# Patient Record
Sex: Female | Born: 1940
Health system: Southern US, Community
[De-identification: ages and names within clinical notes are randomized; demographics above are authoritative.]

## PROBLEM LIST (undated history)

## (undated) DIAGNOSIS — M1990 Unspecified osteoarthritis, unspecified site: Secondary | ICD-10-CM

## (undated) DIAGNOSIS — D649 Anemia, unspecified: Secondary | ICD-10-CM

## (undated) DIAGNOSIS — F329 Major depressive disorder, single episode, unspecified: Secondary | ICD-10-CM

## (undated) DIAGNOSIS — J40 Bronchitis, not specified as acute or chronic: Secondary | ICD-10-CM

## (undated) DIAGNOSIS — F419 Anxiety disorder, unspecified: Secondary | ICD-10-CM

## (undated) DIAGNOSIS — J189 Pneumonia, unspecified organism: Secondary | ICD-10-CM

## (undated) DIAGNOSIS — Z96659 Presence of unspecified artificial knee joint: Secondary | ICD-10-CM

## (undated) DIAGNOSIS — K219 Gastro-esophageal reflux disease without esophagitis: Secondary | ICD-10-CM

## (undated) DIAGNOSIS — R002 Palpitations: Secondary | ICD-10-CM

## (undated) DIAGNOSIS — F32A Depression, unspecified: Secondary | ICD-10-CM

---

## 1978-04-09 HISTORY — PX: TUBAL LIGATION: SHX77

## 1998-02-23 ENCOUNTER — Ambulatory Visit (HOSPITAL_COMMUNITY): Admission: RE | Admit: 1998-02-23 | Discharge: 1998-02-23 | Payer: Self-pay | Admitting: Gastroenterology

## 1999-04-10 HISTORY — PX: INCONTINENCE SURGERY: SHX676

## 1999-09-20 ENCOUNTER — Other Ambulatory Visit: Admission: RE | Admit: 1999-09-20 | Discharge: 1999-09-20 | Payer: Self-pay | Admitting: Obstetrics and Gynecology

## 2000-06-25 ENCOUNTER — Other Ambulatory Visit: Admission: RE | Admit: 2000-06-25 | Discharge: 2000-06-25 | Payer: Self-pay | Admitting: Obstetrics and Gynecology

## 2000-07-18 ENCOUNTER — Other Ambulatory Visit: Admission: RE | Admit: 2000-07-18 | Discharge: 2000-07-18 | Payer: Self-pay | Admitting: Obstetrics and Gynecology

## 2000-07-18 ENCOUNTER — Encounter (INDEPENDENT_AMBULATORY_CARE_PROVIDER_SITE_OTHER): Payer: Self-pay

## 2000-08-23 ENCOUNTER — Other Ambulatory Visit: Admission: RE | Admit: 2000-08-23 | Discharge: 2000-08-23 | Payer: Self-pay | Admitting: Obstetrics and Gynecology

## 2000-08-23 ENCOUNTER — Encounter (INDEPENDENT_AMBULATORY_CARE_PROVIDER_SITE_OTHER): Payer: Self-pay

## 2000-12-24 ENCOUNTER — Other Ambulatory Visit: Admission: RE | Admit: 2000-12-24 | Discharge: 2000-12-24 | Payer: Self-pay | Admitting: Obstetrics and Gynecology

## 2001-02-25 ENCOUNTER — Ambulatory Visit (HOSPITAL_COMMUNITY): Admission: RE | Admit: 2001-02-25 | Discharge: 2001-02-25 | Payer: Self-pay | Admitting: Urology

## 2001-04-07 ENCOUNTER — Encounter: Admission: RE | Admit: 2001-04-07 | Discharge: 2001-04-07 | Payer: Self-pay

## 2001-09-22 ENCOUNTER — Other Ambulatory Visit: Admission: RE | Admit: 2001-09-22 | Discharge: 2001-09-22 | Payer: Self-pay | Admitting: Obstetrics and Gynecology

## 2006-01-20 ENCOUNTER — Emergency Department (HOSPITAL_COMMUNITY): Admission: EM | Admit: 2006-01-20 | Discharge: 2006-01-20 | Payer: Self-pay | Admitting: Emergency Medicine

## 2006-03-21 ENCOUNTER — Ambulatory Visit (HOSPITAL_BASED_OUTPATIENT_CLINIC_OR_DEPARTMENT_OTHER): Admission: RE | Admit: 2006-03-21 | Discharge: 2006-03-21 | Payer: Self-pay | Admitting: Urology

## 2006-03-21 ENCOUNTER — Encounter (INDEPENDENT_AMBULATORY_CARE_PROVIDER_SITE_OTHER): Payer: Self-pay | Admitting: *Deleted

## 2008-09-02 ENCOUNTER — Other Ambulatory Visit: Admission: RE | Admit: 2008-09-02 | Discharge: 2008-09-02 | Payer: Self-pay | Admitting: Family Medicine

## 2010-04-09 DIAGNOSIS — R002 Palpitations: Secondary | ICD-10-CM

## 2010-04-09 HISTORY — PX: REPLACEMENT TOTAL KNEE: SUR1224

## 2010-04-09 HISTORY — DX: Palpitations: R00.2

## 2010-08-25 NOTE — Op Note (Signed)
NAMEBAYLER, Janice Allen              ACCOUNT NO.:  [id number]   MEDICAL RECORD NO.:  [id number]          PATIENT TYPE:  AMB   LOCATION:  [facility]                         FACILITY:  [facility]   PHYSICIAN:  Bertram Millard. Dahlstedt, M.D.DATE OF BIRTH:  Nov 28, 1940   DATE OF PROCEDURE:  03/21/2006  DATE OF DISCHARGE:                               OPERATIVE REPORT   PREOPERATIVE DIAGNOSIS:  History of right renal mass/right renal pelvic  filling defect.   POSTOPERATIVE DIAGNOSIS:  No evident filling defect, probable resolve  pyelonephritis with bleeding.   PROCEDURE:  Cystoscopy, right renal washings, right retrograde  pyelogram.   SURGEON:  Bertram Millard. Dahlstedt, M.D.   ANESTHESIA:  General with LMA.   COMPLICATIONS:  None.   BRIEF HISTORY:  This nice lady first presented to me about 2 months ago  following an emergency room visit.  She presented with gross hematuria  and flank pain.  CT showed a probable mass in the right lower pole as  well as hydronephrosis and a filling defect in the right renal pelvis.  She did have an infection at that time.  That was treated.  Followup  contrasted CT revealed no evident renal pelvic filling defect.  There  was no evidence of residual right renal mass.  Cytologies were  negative/atypical.   At this point, I have presented the patient with either follow her with  a CT scan in a few more months or performing a more definitive cysto,  retrograde and renal washings with possible ureteroscopy on that side.  We need to rule out that she had has no small filling  defects/transitional cell carcinoma.   She has chosen to undergo right retrograde.  Risks and complications of  the procedure have been discussed with the patient.  She understands  these and desires to proceed.   DESCRIPTION OF PROCEDURE:  The patient was administered preoperative IV  antibiotics, the surgical side marked and the patient identified otitis  media the holding area.  She was taken  to the operating room where  general anesthetic was administered.  She was placed in the dorsal  lithotomy position.  Genitalia and perineum were prepped and draped.  A  22-French panendoscope was advanced into her bladder.  Her bladder  appeared normal.  No lesions, no stones, no trabeculations.  Ureteral  orifices were normal in configuration and location.  No blood was seen  coming from either orifice.  The urethra was normal.  The right ureteral  orifice was cannulated with a 6 open-end catheter and with the  assistance of a guidewire was advanced all the way up into the region of  the renal pelvis.  Washings were taken.  There was a little bit of  blood, most likely from some traumatic catheterization.  At first, there  was no blood with the washings.  The washings were sent for cytology.   At this point, retrogrades were performed.  The ureter appeared normal.  There were a couple of small air bubbles that were seen moving up and  down within the ureteral column.  The pyelocaliceal system on the right  was normal.  There was no filling defects seen.  Calyces were all nice  and well defined.   At this point, the ureteral catheter was removed.  The bladder was  drained and the procedure terminated.   The patient tolerated the procedure well.  She was awakened and taken to  PACU in stable condition.      Bertram Millard. Dahlstedt, M.D.  Electronically Signed     SMD/MEDQ  D:  03/21/2006  T:  03/21/2006  Job:  [id number]

## 2010-08-25 NOTE — Op Note (Signed)
[facility]  Patient:    Janice Allen, ROUSER Visit Number: [id number] MRN: [id number]          Service Type: DSU Location: DAY Attending Physician:  Londell Moh Dictated by:   Jamison Neighbor, M.D. Proc. Date: 02/25/01 Admit Date:  02/25/2001                             Operative Report  PREOPERATIVE DIAGNOSIS:  Stress urinary incontinence.  POSTOPERATIVE DIAGNOSIS:  Stress urinary incontinence.  PROCEDURE:  Cystoscopy and SPARC bladder neck suspension.  SURGEON:  Jamison Neighbor, M.D.  ANESTHESIA:  General.  COMPLICATIONS:  None.  DRAINS:  A 16-French Foley catheter to be removed in the recovery room.  HISTORY:  This is a 70 year old female who has well-documented stress urinary incontinence.  The patient has to wear pads on a regular basis and is not improved with Kegel exercises.  The patient has had a careful evaluation by Dr. Richardean Chimera who notes that she has a history of cervical dysplasia, but after a LEEP procedure, she is now free of any disease, and is not felt to require hysterectomy.  The patient has requested that a procedure be done that will be as minimal as possible in an attempt to correct her stress incontinence.  The patient does not wish to have a hysterectomy.  At initial evaluation, she was though to have a cystocele, and if the patient does have a cystocele, she will require anterior repair plus a standard fascial sling if she does not have much of a cystocele on examination.  A SPARC procedure can be considered.   The patient understands the risks and benefits of the procedure. Her principal goal is to try and leave the hospital today and not stay overnight.  She gave full and informed consent.  DESCRIPTION OF PROCEDURE:  After the successful induction of general anesthesia, the patient was placed in the low lithotomy position, and prepped with Betadine, and draped in the usual sterile fashion.  A careful  examination under anesthesia showed that the uterus was in normal position, and there were no palpable masses.  There was no rectocele, and there was a very modest cystocele.  On those circumstances, it was felt that the patient would be a candidate for a SPARC procedure as opposed to an anterior repair and a standard fascial sling.  It was felt that this would be better for the patient in that she had expressed a strong desire to not have a suprapubic tube and to not stay overnight.  The decision to perform the [facility] procedure as opposed to an anterior repair and fascial sling was discussed with the patients family, and they agreed that her desire was to try and leave the hospital as early as possible.  For that reason, the decision was made to make a smaller incision and to perform a SPARC-type sling.  The anterior vaginal mucosa was infiltrated with local anesthesia, and a small incision was made overlying the mid urethra.  Flaps of mucosa were raised bilaterally.  Two small stab incisions were made three fingerbreadths apart, directly above the pubic bone.  The [facility] needles were then passed from the upper incision down to the vaginal incision on each side.  Cystoscopy was performed with both 12 degree and 70 degree lenses.  There was no evidence of any erosion of the needles or placement of the needles within the bladder. The [facility]  sling was then pulled up to the abdominal incision and was appropriately tensioned so that it would support the urethra at the mid urethral complex.  There was enough room for a urethral dilator to be placed underneath the loop just below the urethra, so there should not be over correction.  The protective cover for the [facility] sling was removed and final tensioning was done.  The suture was then cut and removed.  The incision was closed with a running suture of 2-0 Vicryl.  The [facility] sling was then trimmed at the skin level, and was closed with  Steri-Strips.  The patient had additional local anesthetic injected into the periosteal area for postoperative pain management.  The patient was given an intraoperative injection of Toradol and Zofran.  She will be taken to the recovery room in good condition.  She will be monitored carefully, and if the patient voids normally, will be sent home later today without a catheter.  If the patient does not void well, she may need to go home with a Foley catheter in place. Dictated by:   Jamison Neighbor, M.D. Attending Physician:  Londell Moh DD:  02/25/01 TD:  02/25/01 Job: [id number]

## 2010-09-19 ENCOUNTER — Other Ambulatory Visit (HOSPITAL_COMMUNITY): Payer: Self-pay

## 2010-10-02 ENCOUNTER — Ambulatory Visit (HOSPITAL_COMMUNITY)
Admission: RE | Admit: 2010-10-02 | Discharge: 2010-10-02 | Disposition: A | Discharge status: Home / Self Care | Payer: Medicare Other | Source: Ambulatory Visit | Attending: Orthopedic Surgery | Admitting: Orthopedic Surgery

## 2010-10-02 ENCOUNTER — Other Ambulatory Visit (HOSPITAL_COMMUNITY): Payer: Self-pay | Admitting: Orthopedic Surgery

## 2010-10-02 ENCOUNTER — Encounter (HOSPITAL_COMMUNITY)
Admission: RE | Admit: 2010-10-02 | Discharge: 2010-10-02 | Disposition: A | Discharge status: Home / Self Care | Payer: Medicare Other | Source: Ambulatory Visit | Attending: Orthopedic Surgery | Admitting: Orthopedic Surgery

## 2010-10-02 DIAGNOSIS — Z01818 Encounter for other preprocedural examination: Secondary | ICD-10-CM | POA: Insufficient documentation

## 2010-10-02 DIAGNOSIS — Z01810 Encounter for preprocedural cardiovascular examination: Secondary | ICD-10-CM | POA: Insufficient documentation

## 2010-10-02 DIAGNOSIS — IMO0002 Reserved for concepts with insufficient information to code with codable children: Secondary | ICD-10-CM | POA: Insufficient documentation

## 2010-10-02 DIAGNOSIS — M1710 Unilateral primary osteoarthritis, unspecified knee: Secondary | ICD-10-CM | POA: Insufficient documentation

## 2010-10-02 DIAGNOSIS — M1712 Unilateral primary osteoarthritis, left knee: Secondary | ICD-10-CM

## 2010-10-02 DIAGNOSIS — Z01812 Encounter for preprocedural laboratory examination: Secondary | ICD-10-CM | POA: Insufficient documentation

## 2010-10-02 LAB — DIFFERENTIAL
Basophils Absolute: 0 10*3/uL (ref 0.0–0.1)
Basophils Relative: 0 % (ref 0–1)
Lymphocytes Relative: 32 % (ref 12–46)
Monocytes Absolute: 0.6 10*3/uL (ref 0.1–1.0)
Neutro Abs: 4.2 10*3/uL (ref 1.7–7.7)
Neutrophils Relative %: 58 % (ref 43–77)

## 2010-10-02 LAB — URINALYSIS, ROUTINE W REFLEX MICROSCOPIC
Leukocytes, UA: NEGATIVE
Nitrite: NEGATIVE
Specific Gravity, Urine: 1.027 (ref 1.005–1.030)
Urobilinogen, UA: 0.2 mg/dL (ref 0.0–1.0)
pH: 6 (ref 5.0–8.0)

## 2010-10-02 LAB — CBC
Hemoglobin: 13.6 g/dL (ref 12.0–15.0)
MCHC: 34.2 g/dL (ref 30.0–36.0)
RBC: 4.62 MIL/uL (ref 3.87–5.11)
WBC: 7.2 10*3/uL (ref 4.0–10.5)

## 2010-10-02 LAB — COMPREHENSIVE METABOLIC PANEL
Albumin: 4 g/dL (ref 3.5–5.2)
BUN: 17 mg/dL (ref 6–23)
Calcium: 9.1 mg/dL (ref 8.4–10.5)
Creatinine, Ser: 0.59 mg/dL (ref 0.50–1.10)
GFR calc Af Amer: >60 mL/min (ref >60)
Glucose, Bld: 113 mg/dL — ABNORMAL HIGH (ref 70–99)
Potassium: 3.6 mEq/L (ref 3.5–5.1)
Total Protein: 6.4 g/dL (ref 6.0–8.3)

## 2010-10-02 LAB — SURGICAL PCR SCREEN
MRSA, PCR: NEGATIVE
Staphylococcus aureus: NEGATIVE

## 2010-10-02 LAB — APTT: aPTT: 33 seconds (ref 24–37)

## 2010-10-02 LAB — TYPE AND SCREEN
ABO/RH(D): O POS
Antibody Screen: NEGATIVE

## 2010-10-02 LAB — PROTIME-INR
INR: 0.97 (ref 0.00–1.49)
Prothrombin Time: 13.1 seconds (ref 11.6–15.2)

## 2010-10-02 LAB — ABO/RH: ABO/RH(D): O POS

## 2010-10-04 ENCOUNTER — Inpatient Hospital Stay (HOSPITAL_COMMUNITY)
Admission: RE | Admit: 2010-10-04 | Discharge: 2010-10-06 | DRG: 470 | Disposition: A | Discharge status: Skilled Nursing Facility | Payer: Medicare Other | Source: Ambulatory Visit | Attending: Orthopedic Surgery | Admitting: Orthopedic Surgery

## 2010-10-04 DIAGNOSIS — F341 Dysthymic disorder: Secondary | ICD-10-CM | POA: Diagnosis present

## 2010-10-04 DIAGNOSIS — M1710 Unilateral primary osteoarthritis, unspecified knee: Principal | ICD-10-CM | POA: Diagnosis present

## 2010-10-04 DIAGNOSIS — IMO0001 Reserved for inherently not codable concepts without codable children: Secondary | ICD-10-CM | POA: Diagnosis present

## 2010-10-05 LAB — BASIC METABOLIC PANEL
CO2: 27 mEq/L (ref 19–32)
Chloride: 98 mEq/L (ref 96–112)
Creatinine, Ser: 0.58 mg/dL (ref 0.50–1.10)
GFR calc Af Amer: >60 mL/min (ref >60)
Sodium: 133 mEq/L — ABNORMAL LOW (ref 135–145)

## 2010-10-05 LAB — CBC
Platelets: 193 10*3/uL (ref 150–400)
RBC: 3.68 MIL/uL — ABNORMAL LOW (ref 3.87–5.11)
RDW: 14.1 % (ref 11.5–15.5)
WBC: 10.8 10*3/uL — ABNORMAL HIGH (ref 4.0–10.5)

## 2010-10-05 LAB — PROTIME-INR
INR: 1.11 (ref 0.00–1.49)
Prothrombin Time: 14.5 seconds (ref 11.6–15.2)

## 2010-10-06 LAB — PROTIME-INR
INR: 1.48 (ref 0.00–1.49)
Prothrombin Time: 18.2 seconds — ABNORMAL HIGH (ref 11.6–15.2)

## 2010-10-06 LAB — CBC
Hemoglobin: 10 g/dL — ABNORMAL LOW (ref 12.0–15.0)
MCH: 29.2 pg (ref 26.0–34.0)
MCHC: 33.9 g/dL (ref 30.0–36.0)
Platelets: 170 10*3/uL (ref 150–400)
RBC: 3.42 MIL/uL — ABNORMAL LOW (ref 3.87–5.11)

## 2010-10-12 ENCOUNTER — Emergency Department (HOSPITAL_COMMUNITY): Payer: Medicare Other

## 2010-10-12 ENCOUNTER — Emergency Department (HOSPITAL_COMMUNITY)
Admission: EM | Admit: 2010-10-12 | Discharge: 2010-10-13 | Disposition: A | Discharge status: Home / Self Care | Payer: Medicare Other | Attending: Emergency Medicine | Admitting: Emergency Medicine

## 2010-10-12 DIAGNOSIS — R002 Palpitations: Secondary | ICD-10-CM | POA: Insufficient documentation

## 2010-10-12 DIAGNOSIS — Z7901 Long term (current) use of anticoagulants: Secondary | ICD-10-CM | POA: Insufficient documentation

## 2010-10-12 DIAGNOSIS — R079 Chest pain, unspecified: Secondary | ICD-10-CM | POA: Insufficient documentation

## 2010-10-12 DIAGNOSIS — F410 Panic disorder [episodic paroxysmal anxiety] without agoraphobia: Secondary | ICD-10-CM | POA: Insufficient documentation

## 2010-10-12 DIAGNOSIS — Z79899 Other long term (current) drug therapy: Secondary | ICD-10-CM | POA: Insufficient documentation

## 2010-10-12 HISTORY — DX: Presence of unspecified artificial knee joint: Z96.659

## 2010-10-12 LAB — D-DIMER, QUANTITATIVE: D-Dimer, Quant: 2.82 ug/mL-FEU — ABNORMAL HIGH (ref 0.00–0.48)

## 2010-10-12 LAB — COMPREHENSIVE METABOLIC PANEL
ALT: 12 U/L (ref 0–35)
BUN: 20 mg/dL (ref 6–23)
CO2: 29 mEq/L (ref 19–32)
Calcium: 8.7 mg/dL (ref 8.4–10.5)
Creatinine, Ser: 0.63 mg/dL (ref 0.50–1.10)
GFR calc Af Amer: >60 mL/min (ref >60)
GFR calc non Af Amer: >60 mL/min (ref >60)
Glucose, Bld: 121 mg/dL — ABNORMAL HIGH (ref 70–99)
Sodium: 137 mEq/L (ref 135–145)

## 2010-10-12 LAB — DIFFERENTIAL
Basophils Absolute: 0 10*3/uL (ref 0.0–0.1)
Basophils Relative: 0 % (ref 0–1)
Eosinophils Absolute: 0.1 10*3/uL (ref 0.0–0.7)
Monocytes Absolute: 1.2 10*3/uL — ABNORMAL HIGH (ref 0.1–1.0)
Monocytes Relative: 12 % (ref 3–12)

## 2010-10-12 LAB — CK TOTAL AND CKMB (NOT AT ARMC)
CK, MB: 2.3 ng/mL (ref 0.3–4.0)
Total CK: 78 U/L (ref 7–177)

## 2010-10-12 LAB — URINALYSIS, ROUTINE W REFLEX MICROSCOPIC
Bilirubin Urine: NEGATIVE
Hgb urine dipstick: NEGATIVE
Nitrite: NEGATIVE
Protein, ur: NEGATIVE mg/dL
Specific Gravity, Urine: 1.016 (ref 1.005–1.030)
Urobilinogen, UA: 0.2 mg/dL (ref 0.0–1.0)

## 2010-10-12 LAB — CBC
MCH: 28.7 pg (ref 26.0–34.0)
MCHC: 33.2 g/dL (ref 30.0–36.0)
Platelets: 254 10*3/uL (ref 150–400)
RDW: 14.2 % (ref 11.5–15.5)

## 2010-10-12 LAB — PROTIME-INR: INR: 2.24 — ABNORMAL HIGH (ref 0.00–1.49)

## 2010-10-13 ENCOUNTER — Encounter (HOSPITAL_COMMUNITY): Payer: Self-pay | Admitting: Radiology

## 2010-10-13 MED ORDER — IOHEXOL 300 MG/ML  SOLN
100.0000 mL | Freq: Once | INTRAMUSCULAR | Status: AC | PRN
  Administered 2010-10-13: 100 mL via INTRAVENOUS

## 2010-10-25 NOTE — Discharge Summary (Signed)
NAMEGIANNIE, Janice Allen              ACCOUNT NO.:  [id number]  MEDICAL RECORD NO.:  [id number]  LOCATION:  5029                         FACILITY:  [facility]  PHYSICIAN:  Harvie Junior, M.D.   DATE OF BIRTH:  01/25/1941  DATE OF ADMISSION:  10/04/2010 DATE OF DISCHARGE:  10/07/2010                              DISCHARGE SUMMARY   ADMITTING DIAGNOSES: 1. End-stage osteoarthritis, left knee. 2. Osteoarthritis, right knee. 3. History of depression. 4. History of mild-to-moderate anxiety.  DISCHARGE DIAGNOSES: 1. End-stage osteoarthritis, left knee. 2. Osteoarthritis, right knee. 3. History of depression. 4. History of mild-to-moderate anxiety.  PROCEDURES IN HOSPITAL: 1. Left total knee arthroplasty computer-assisted, Jodi Geralds MD on     October 04, 2010. 2. Cortisone injection right knee on October 04, 2010.  BRIEF HISTORY:  Janice Allen is a pleasant 70 year old female who has a long history of progressive pain in her left knee associated with activity.  She has night pain and has pain with ambulation. Weightbearing x-rays of the left knee show that she had bone-on-bone degenerative arthritis with significant spurring in the patellofemoral joint.  X-rays of the right knee also showed significant osteoarthritic changes and she has requested a cortisone injection for her right knee osteoarthritis at the time of her surgery.  Based upon her clinical and radiographic findings and due to the fact that she did not improve with exhaustive conservative treatment, she is admitted for a left total knee arthroplasty.  PERTINENT LABORATORY STUDIES:  Hemoglobin on admission was 13.6, hematocrit 39.8, and WBC 7.2.  On postop day #1, her hemoglobin was 10.5 with a hematocrit of 31.4.  On postop day #2, her hemoglobin was 10.0 with hematocrit of 29.5.  BMET on admission showed sodium 138, potassium 3.6, BUN 17, creatinine 0.59, and glucose 113.  On postop day #1, her sodium was 133, potassium  4.1, BUN and creatinine normal range and glucose was 206.  Pro-time on admission was 13.1 seconds and INR of 0.97 and a PTT of 33.  On postop day #2 at the time of this dictation, her pro time was 18.2 seconds with an INR of 1.48 on Coumadin therapy managed per pharmacy.  Urinalysis on admission showed no abnormalities. Preoperative chest x-ray showed no acute cardiopulmonary abnormality.  HOSPITAL COURSE:  The patient was brought to the operating room on October 04, 2010.  Preoperatively, she was given 80 mg IV of gentamicin and Ancef 1 g IV prophylactically.  A femoral nerve block was done by Anesthesia to her left anterior thigh for postoperative pain reduction. She was taken to the operating room where she underwent left total knee arthroplasty, it is well described in Dr. Luiz Blare' operative note.  She also had a right knee injected with cortisone at the time of her surgery while under anesthesia.  Postoperatively, she was given a g of Ancef 1 g IV q.8 h. x24 hours.  She was started on Coumadin anti-thrombolytic therapy per pharmacy protocol shooting for an INR of 2.0.  PCA morphine pump was used for pain control x1 day, a Foley catheter was placed at the time of her surgery.  The Foley catheter was removed on postop day #1.  Her  PCA morphine was also discontinued on postop day #1.  Physical therapy was ordered for walker ambulation, weightbearing as tolerated on the left, ambulating with a walker.  CPM machine was used for range of motion of her left knee.  On postop day #1, her hemoglobin was 10.5. BMET was essentially normal, INR was 1.1.  On postop day #2, the patient had her dressing changed and a Hemovac drain was pulled.  Her wound was benign.  Her calf was soft and nontender.  Her knee range of motion at that time was 0 degrees to 90 degrees of flexion.  Neurovascular status was intact distally.  She was continued on oral Coumadin for DVT prophylaxis and physical therapy,  weightbearing as tolerated on the left.  We allowed her to discontinue her knee immobilizer in bed and when up with a walker, but if she is without the walker, she needs it, so that she does not fall if her leg does give way.  [facility] will be [facility] on October 07, 2010, postop day #3.  She will be in improved condition.  DISCHARGE DIET:  She will be on a regular diet.  DISCHARGE MEDICATIONS:  Her medications at the time of discharge will include, 1. Artificial tears over-the-counter to both eyes 1 drop 4 times daily     as needed. 2. Alprazolam 0.5 mg one of b.i.d. p.r.n. anxiety. 3. Ultram 50 mg 1-2 q.6 h. p.r.n. pain. 4. Coumadin per pharmacy protocol shooting for an INR of 2.0 x1 month     postop.  DISCHARGE ACTIVITY:  Her activity status will be weightbearing as tolerated on the left, ambulate with a walker.  She will need daily physical therapy for aggressive left knee range of motion, early strengthening.  She will need a CPM machine 8 hours per 24 hours, shooting for 0 degrees to 90 degrees range of motion that can bump it up to 100 degrees if tolerated.  She will need that x7-10 days once she leaves the hospital.  FOLLOWUP:  She will need to follow up with Dr. Jodi Geralds or Marshia Ly, P.A. at [facility], phone number [phone] in 10-14 days.  If you have any questions, we can be reached in our office.     Marshia Ly, P.A.   ______________________________ Harvie Junior, M.D.    [name]  D:  10/06/2010  T:  10/06/2010  Job:  [id number]  cc:   L. Lupe Carney, M.D. Harvie Junior, M.D.  Electronically Signed by Marshia Ly P.A. on 10/19/2010 02:24:18 PM Electronically Signed by Jodi Geralds M.D. on 10/25/2010 09:09:01 PM

## 2010-10-25 NOTE — Op Note (Signed)
Janice, Allen NO.:  [id number]  MEDICAL RECORD NO.:  [id number]  LOCATION:  5029                         FACILITY:  [facility]  PHYSICIAN:  Harvie Junior, M.D.   DATE OF BIRTH:  1941-03-08  DATE OF PROCEDURE:  10/04/2010 DATE OF DISCHARGE:                              OPERATIVE REPORT   PREOPERATIVE DIAGNOSIS:  End-stage degenerative joint disease, left knee.  POSTOPERATIVE DIAGNOSIS:  End-stage degenerative joint disease, left knee.  PRINCIPAL PROCEDURE:  Left total knee replacement with a Sigma system, size 3 femur, size 3 tibia, 10-mm bridging bearing, and a 35-mm all- polyethylene patella.  Second surgical procedure would be computer- assisted left total knee replacement.  SURGEON:  Harvie Junior, MD.  ANESTHESIA:  Marshia Ly, PA.  ANESTHESIA:  General.  BRIEF HISTORY:  [name] is a 70 year old female with a long history of having had significant complaints of bilateral knee pain, leftgreater than right after failure of all conservative care.  Because of bone-on-bone arthritis, she was ultimately taken to the operating room for left total knee replacement.  Because of her young age and longevity in the family, I felt that computer assistance is appropriate.  This was chosen to be used preoperatively.  She was brought to the operating room for this procedure.  DESCRIPTION OF PROCEDURE:  The patient was brought to the operating room.  After adequate anesthesia was obtained with general anesthetic, the patient was placed supine on the operating table.  The left knee was then prepped and draped in usual sterile fashion.  Following this, the leg was exsanguinate.  Blood pressure tourniquet was inflated to 350 mmHg.  Following this, midline incision was made.  Subcutaneous tissue was dissected down to level of the extensor mechanism and a medial parapatellar arthrotomy was undertaken.  Once that was completed, attention was turned towards  the knee where anterior and posterior cruciate were removed as well as mediolateral meniscus, retropatellar fat pad, and synovium in the anterior aspect of the femur.  Following this, attention was turned towards placement of the computer two pins and the tibia two pins in the femur and the arrays were placed.  The registration process was undertaken, this adds 30 minutes of surgical procedure.  Once this was completed, attention was then turned towards the tibia which cut perpendicular to its long axis.  Attention was turned to the femur, which cut perpendicular to the anatomic axis.  Once this was completed, spacer block was put in place and gave a perfect neutral long alignment and gap balance.  Attention was then turned to the femur which was sized to 3, anterior and posterior cuts were made as well as chamfers and box, and then attention was turned toward the tibia sized to a 3, drilled and keeled.  A trial component was placed with a 10-mm bridging bearing.  The computer showed perfect neutral long alignment and gap balances. Attention was then turned to the patella cut down to a level of 14 mm and the lugs were then drilled for a 35-mm all poly patella.  Patella was placed.  Range of motion was perfect. Alignment perfect.  At this point, the trial components were  removed. The knee was copiously and thoroughly lavaged and suctioned dry.  The bone interstices were dried completely.  Once this was done, the final components were then cemented into place, size 3 femur, size 3 tibia, 10- mm bridging bearing, and 3 5-mm all polyethylene patella.  Once that was completed, the knee was copiously and thoroughly lavaged and suctioned dry.  The tourniquet was then let down after the cement was allowed to harden and all bleeders were controlled with electrocautery.  At this point, a final polyethylene was put in place.  Final check of the computer showed perfect neutral long alignment and gap  balance.  At this point, the computer modules were removed and the medium Hemovac drain was placed.  The medial parapatellar arthrotomy was closed with one Vicryl running, the skin with 0 and 2-0 Vicryl and 3-0 Monocryl subcuticular.  Benzoin and Steri-Strips were applied.  Sterile compressive dressing was applied.  The patient was taken to the recovery room, was noted to be in satisfactory condition.  Estimated blood loss for this procedure was none.     Harvie Junior, M.D.     Ranae Plumber  D:  10/04/2010  T:  10/05/2010  Job:  [id number]  Electronically Signed by Jodi Geralds M.D. on 10/25/2010 09:08:59 PM

## 2011-05-16 ENCOUNTER — Other Ambulatory Visit: Payer: Self-pay | Admitting: Orthopedic Surgery

## 2011-05-28 ENCOUNTER — Encounter (HOSPITAL_COMMUNITY): Payer: Self-pay | Admitting: Pharmacy Technician

## 2011-05-31 ENCOUNTER — Ambulatory Visit (HOSPITAL_COMMUNITY)
Admission: RE | Admit: 2011-05-31 | Discharge: 2011-05-31 | Disposition: A | Discharge status: Home / Self Care | Payer: Medicare Other | Source: Ambulatory Visit | Attending: Orthopedic Surgery | Admitting: Orthopedic Surgery

## 2011-05-31 ENCOUNTER — Encounter (HOSPITAL_COMMUNITY)
Admission: RE | Admit: 2011-05-31 | Discharge: 2011-05-31 | Disposition: A | Discharge status: Home / Self Care | Payer: Medicare Other | Source: Ambulatory Visit | Attending: Orthopedic Surgery | Admitting: Orthopedic Surgery

## 2011-05-31 ENCOUNTER — Encounter (HOSPITAL_COMMUNITY): Payer: Self-pay

## 2011-05-31 DIAGNOSIS — Z01810 Encounter for preprocedural cardiovascular examination: Secondary | ICD-10-CM | POA: Insufficient documentation

## 2011-05-31 DIAGNOSIS — Z01818 Encounter for other preprocedural examination: Secondary | ICD-10-CM | POA: Insufficient documentation

## 2011-05-31 DIAGNOSIS — Z01812 Encounter for preprocedural laboratory examination: Secondary | ICD-10-CM | POA: Insufficient documentation

## 2011-05-31 HISTORY — DX: Bronchitis, not specified as acute or chronic: J40

## 2011-05-31 HISTORY — DX: Depression, unspecified: F32.A

## 2011-05-31 HISTORY — DX: Anemia, unspecified: D64.9

## 2011-05-31 HISTORY — DX: Palpitations: R00.2

## 2011-05-31 HISTORY — DX: Major depressive disorder, single episode, unspecified: F32.9

## 2011-05-31 HISTORY — DX: Anxiety disorder, unspecified: F41.9

## 2011-05-31 HISTORY — DX: Pneumonia, unspecified organism: J18.9

## 2011-05-31 LAB — COMPREHENSIVE METABOLIC PANEL
ALT: 13 U/L (ref 0–35)
AST: 18 U/L (ref 0–37)
Alkaline Phosphatase: 73 U/L (ref 39–117)
CO2: 31 mEq/L (ref 19–32)
Calcium: 10 mg/dL (ref 8.4–10.5)
Glucose, Bld: 102 mg/dL — ABNORMAL HIGH (ref 70–99)
Potassium: 3.9 mEq/L (ref 3.5–5.1)
Sodium: 139 mEq/L (ref 135–145)
Total Protein: 7.3 g/dL (ref 6.0–8.3)

## 2011-05-31 LAB — CBC
Hemoglobin: 14.3 g/dL (ref 12.0–15.0)
MCH: 29.2 pg (ref 26.0–34.0)
MCHC: 33.4 g/dL (ref 30.0–36.0)
Platelets: 168 10*3/uL (ref 150–400)
RBC: 4.89 MIL/uL (ref 3.87–5.11)

## 2011-05-31 LAB — URINALYSIS, ROUTINE W REFLEX MICROSCOPIC
Leukocytes, UA: NEGATIVE
Nitrite: NEGATIVE
Specific Gravity, Urine: 1.026 (ref 1.005–1.030)
Urobilinogen, UA: 0.2 mg/dL (ref 0.0–1.0)
pH: 6.5 (ref 5.0–8.0)

## 2011-05-31 LAB — DIFFERENTIAL
Eosinophils Absolute: 0.1 10*3/uL (ref 0.0–0.7)
Eosinophils Relative: 1 % (ref 0–5)
Lymphocytes Relative: 35 % (ref 12–46)
Lymphs Abs: 2.3 10*3/uL (ref 0.7–4.0)
Monocytes Absolute: 0.9 10*3/uL (ref 0.1–1.0)

## 2011-05-31 LAB — TYPE AND SCREEN: Antibody Screen: NEGATIVE

## 2011-05-31 LAB — SURGICAL PCR SCREEN
MRSA, PCR: NEGATIVE
Staphylococcus aureus: NEGATIVE

## 2011-05-31 LAB — APTT: aPTT: 28 seconds (ref 24–37)

## 2011-05-31 NOTE — Pre-Procedure Instructions (Signed)
20 Janice Allen  05/31/2011   Your procedure is scheduled on:  Fri, Mar 1 @ 0730  Report to [facility] at 0530 AM.  Call this number if you have problems the morning of surgery: [phone]   Remember:   Do not eat food:After Midnight.  May have clear liquids: up to 4 Hours before arrival.(until 1:30 am)  Clear liquids include soda, tea, black coffee, apple or grape juice, broth.  Take these medicines the morning of surgery with A SIP OF WATER:    Do not wear jewelry, make-up or nail polish.  Do not wear lotions, powders, or perfumes. You may wear deodorant.  Do not shave 48 hours prior to surgery.  Do not bring valuables to the hospital.  Contacts, dentures or bridgework may not be worn into surgery.  Leave suitcase in the car. After surgery it may be brought to your room.  For patients admitted to the hospital, checkout time is 11:00 AM the day of discharge.   Patients discharged the day of surgery will not be allowed to drive home.  Name and phone number of your driver:   Special Instructions: CHG Shower Use Special Wash: 1/2 bottle night before surgery and 1/2 bottle morning of surgery.   Please read over the following fact sheets that you were given: Pain Booklet, Coughing and Deep Breathing, Blood Transfusion Information, Total Joint Packet, MRSA Information and Surgical Site Infection Prevention

## 2011-06-07 MED ORDER — CEFAZOLIN SODIUM 1-5 GM-% IV SOLN
1.0000 g | INTRAVENOUS | Status: AC
  Administered 2011-06-08: 1 g via INTRAVENOUS
  Filled 2011-06-07: qty 50

## 2011-06-07 MED ORDER — POVIDONE-IODINE 7.5 % EX SOLN
Freq: Once | CUTANEOUS | Status: DC
  Filled 2011-06-07: qty 118

## 2011-06-08 ENCOUNTER — Encounter (HOSPITAL_COMMUNITY): Payer: Self-pay | Admitting: Vascular Surgery

## 2011-06-08 ENCOUNTER — Encounter (HOSPITAL_COMMUNITY): Payer: Self-pay | Admitting: Anesthesiology

## 2011-06-08 ENCOUNTER — Ambulatory Visit (HOSPITAL_COMMUNITY): Payer: Medicare Other | Admitting: Vascular Surgery

## 2011-06-08 ENCOUNTER — Encounter (HOSPITAL_COMMUNITY)
Admission: RE | Disposition: A | Discharge status: Skilled Nursing Facility | Payer: Self-pay | Source: Ambulatory Visit | Attending: Orthopedic Surgery

## 2011-06-08 ENCOUNTER — Encounter (HOSPITAL_COMMUNITY): Payer: Self-pay | Admitting: *Deleted

## 2011-06-08 ENCOUNTER — Inpatient Hospital Stay (HOSPITAL_COMMUNITY)
Admission: RE | Admit: 2011-06-08 | Discharge: 2011-06-11 | DRG: 470 | Disposition: A | Discharge status: Skilled Nursing Facility | Payer: Medicare Other | Source: Ambulatory Visit | Attending: Orthopedic Surgery | Admitting: Orthopedic Surgery

## 2011-06-08 DIAGNOSIS — M1710 Unilateral primary osteoarthritis, unspecified knee: Principal | ICD-10-CM | POA: Diagnosis present

## 2011-06-08 DIAGNOSIS — M1711 Unilateral primary osteoarthritis, right knee: Secondary | ICD-10-CM | POA: Diagnosis present

## 2011-06-08 DIAGNOSIS — Z96659 Presence of unspecified artificial knee joint: Secondary | ICD-10-CM

## 2011-06-08 DIAGNOSIS — Z8701 Personal history of pneumonia (recurrent): Secondary | ICD-10-CM

## 2011-06-08 HISTORY — PX: KNEE ARTHROPLASTY: SHX992

## 2011-06-08 SURGERY — ARTHROPLASTY, KNEE, TOTAL, USING IMAGELESS COMPUTER-ASSISTED NAVIGATION
Anesthesia: General | Site: Knee | Wound class: Clean

## 2011-06-08 MED ORDER — SODIUM CHLORIDE 0.9 % IR SOLN
Status: DC | PRN
  Administered 2011-06-08: 1000 mL
  Administered 2011-06-08: 3000 mL

## 2011-06-08 MED ORDER — CEFAZOLIN SODIUM 1-5 GM-% IV SOLN
1.0000 g | Freq: Four times a day (QID) | INTRAVENOUS | Status: AC
  Administered 2011-06-08 – 2011-06-09 (×3): 1 g via INTRAVENOUS
  Filled 2011-06-08 (×3): qty 50

## 2011-06-08 MED ORDER — GLYCOPYRROLATE 0.2 MG/ML IJ SOLN
INTRAMUSCULAR | Status: DC | PRN
  Administered 2011-06-08: .7 mg via INTRAVENOUS

## 2011-06-08 MED ORDER — NEOSTIGMINE METHYLSULFATE 1 MG/ML IJ SOLN
INTRAMUSCULAR | Status: DC | PRN
  Administered 2011-06-08: 4 mg via INTRAVENOUS

## 2011-06-08 MED ORDER — ALPRAZOLAM 0.25 MG PO TABS: 0.1250 mg | ORAL_TABLET | Freq: Every evening | ORAL | Status: DC | PRN

## 2011-06-08 MED ORDER — FERROUS SULFATE 325 (65 FE) MG PO TABS
325.0000 mg | ORAL_TABLET | Freq: Two times a day (BID) | ORAL | Status: DC
  Administered 2011-06-08 – 2011-06-11 (×6): 325 mg via ORAL
  Filled 2011-06-08 (×9): qty 1

## 2011-06-08 MED ORDER — ZOLPIDEM TARTRATE 5 MG PO TABS: 5.0000 mg | ORAL_TABLET | Freq: Every evening | ORAL | Status: DC | PRN

## 2011-06-08 MED ORDER — PROPOFOL 10 MG/ML IV EMUL
INTRAVENOUS | Status: DC | PRN
  Administered 2011-06-08: 200 mg via INTRAVENOUS

## 2011-06-08 MED ORDER — MEPERIDINE HCL 25 MG/ML IJ SOLN: 6.2500 mg | INTRAMUSCULAR | Status: DC | PRN

## 2011-06-08 MED ORDER — ALPRAZOLAM 0.5 MG PO TABS: 0.0125 mg | ORAL_TABLET | Freq: Every evening | ORAL | Status: DC | PRN

## 2011-06-08 MED ORDER — WARFARIN - PHARMACIST DOSING INPATIENT
Freq: Every day | Status: DC
  Administered 2011-06-08 – 2011-06-09 (×2)
  Filled 2011-06-08 (×4): qty 1

## 2011-06-08 MED ORDER — NALOXONE HCL 0.4 MG/ML IJ SOLN: 0.4000 mg | INTRAMUSCULAR | Status: DC | PRN

## 2011-06-08 MED ORDER — LACTATED RINGERS IV SOLN
INTRAVENOUS | Status: DC | PRN
  Administered 2011-06-08 (×2): via INTRAVENOUS

## 2011-06-08 MED ORDER — HYDROMORPHONE HCL PF 1 MG/ML IJ SOLN
0.2500 mg | INTRAMUSCULAR | Status: DC | PRN
  Administered 2011-06-08 (×4): 0.5 mg via INTRAVENOUS

## 2011-06-08 MED ORDER — ALUM & MAG HYDROXIDE-SIMETH 200-200-20 MG/5ML PO SUSP: 30.0000 mL | ORAL | Status: DC | PRN

## 2011-06-08 MED ORDER — ONDANSETRON HCL 4 MG/2ML IJ SOLN: 4.0000 mg | Freq: Four times a day (QID) | INTRAMUSCULAR | Status: DC | PRN

## 2011-06-08 MED ORDER — MIDAZOLAM HCL 5 MG/5ML IJ SOLN
INTRAMUSCULAR | Status: DC | PRN
  Administered 2011-06-08: 2 mg via INTRAVENOUS

## 2011-06-08 MED ORDER — VITAMIN D3 25 MCG (1000 UT) PO CAPS: 1.0000 | ORAL_CAPSULE | Freq: Every day | ORAL | Status: DC

## 2011-06-08 MED ORDER — DIPHENHYDRAMINE HCL 12.5 MG/5ML PO ELIX: 12.5000 mg | ORAL_SOLUTION | Freq: Four times a day (QID) | ORAL | Status: DC | PRN

## 2011-06-08 MED ORDER — SODIUM CHLORIDE 0.9 % IJ SOLN: 9.0000 mL | INTRAMUSCULAR | Status: DC | PRN

## 2011-06-08 MED ORDER — ROCURONIUM BROMIDE 100 MG/10ML IV SOLN
INTRAVENOUS | Status: DC | PRN
  Administered 2011-06-08: 50 mg via INTRAVENOUS

## 2011-06-08 MED ORDER — ONDANSETRON HCL 4 MG/2ML IJ SOLN: 4.0000 mg | Freq: Once | INTRAMUSCULAR | Status: DC | PRN

## 2011-06-08 MED ORDER — FENTANYL CITRATE 0.05 MG/ML IJ SOLN
INTRAMUSCULAR | Status: DC | PRN
  Administered 2011-06-08 (×5): 50 ug via INTRAVENOUS

## 2011-06-08 MED ORDER — METHOCARBAMOL 500 MG PO TABS
500.0000 mg | ORAL_TABLET | Freq: Four times a day (QID) | ORAL | Status: DC | PRN
  Administered 2011-06-09 – 2011-06-11 (×7): 500 mg via ORAL
  Filled 2011-06-08 (×8): qty 1

## 2011-06-08 MED ORDER — ONDANSETRON HCL 4 MG PO TABS: 4.0000 mg | ORAL_TABLET | Freq: Four times a day (QID) | ORAL | Status: DC | PRN

## 2011-06-08 MED ORDER — MORPHINE SULFATE 2 MG/ML IJ SOLN: 0.0500 mg/kg | INTRAMUSCULAR | Status: DC | PRN

## 2011-06-08 MED ORDER — ONDANSETRON HCL 4 MG/2ML IJ SOLN
INTRAMUSCULAR | Status: DC | PRN
  Administered 2011-06-08: 4 mg via INTRAVENOUS

## 2011-06-08 MED ORDER — WARFARIN SODIUM 5 MG PO TABS
5.0000 mg | ORAL_TABLET | Freq: Once | ORAL | Status: AC
  Administered 2011-06-08: 5 mg via ORAL
  Filled 2011-06-08 (×2): qty 1

## 2011-06-08 MED ORDER — PROMETHAZINE HCL 25 MG/ML IJ SOLN: 12.5000 mg | Freq: Four times a day (QID) | INTRAMUSCULAR | Status: DC | PRN

## 2011-06-08 MED ORDER — METHOCARBAMOL 100 MG/ML IJ SOLN: 500.0000 mg | Freq: Four times a day (QID) | INTRAVENOUS | Status: DC | PRN

## 2011-06-08 MED ORDER — OXYCODONE HCL 5 MG PO TABS
5.0000 mg | ORAL_TABLET | ORAL | Status: DC | PRN
  Administered 2011-06-09: 5 mg via ORAL
  Administered 2011-06-09: 10 mg via ORAL
  Administered 2011-06-09: 5 mg via ORAL
  Filled 2011-06-08 (×3): qty 1
  Filled 2011-06-08: qty 2

## 2011-06-08 MED ORDER — MORPHINE SULFATE 4 MG/ML IJ SOLN: 0.0500 mg/kg | INTRAMUSCULAR | Status: DC | PRN

## 2011-06-08 MED ORDER — MORPHINE SULFATE (PF) 1 MG/ML IV SOLN
INTRAVENOUS | Status: DC
  Administered 2011-06-08: 1 mg via INTRAVENOUS
  Administered 2011-06-08: 3 mg via INTRAVENOUS
  Administered 2011-06-08: 1 mg via INTRAVENOUS
  Administered 2011-06-08: 25 mL via INTRAVENOUS
  Administered 2011-06-09: 2 mg via INTRAVENOUS

## 2011-06-08 MED ORDER — VITAMIN D3 25 MCG (1000 UNIT) PO TABS
1000.0000 [IU] | ORAL_TABLET | Freq: Every day | ORAL | Status: DC
  Administered 2011-06-08 – 2011-06-11 (×4): 1000 [IU] via ORAL
  Filled 2011-06-08 (×5): qty 1

## 2011-06-08 MED ORDER — DEXTROSE-NACL 5-0.45 % IV SOLN: INTRAVENOUS | Status: DC

## 2011-06-08 MED ORDER — KETOROLAC TROMETHAMINE 30 MG/ML IJ SOLN
15.0000 mg | Freq: Four times a day (QID) | INTRAMUSCULAR | Status: AC
  Administered 2011-06-09: 15 mg via INTRAVENOUS
  Filled 2011-06-08: qty 1

## 2011-06-08 MED ORDER — DIPHENHYDRAMINE HCL 50 MG/ML IJ SOLN
12.5000 mg | Freq: Four times a day (QID) | INTRAMUSCULAR | Status: DC | PRN
  Filled 2011-06-08: qty 1

## 2011-06-08 MED ORDER — DOCUSATE SODIUM 100 MG PO CAPS
100.0000 mg | ORAL_CAPSULE | Freq: Two times a day (BID) | ORAL | Status: DC
  Administered 2011-06-08 – 2011-06-11 (×6): 100 mg via ORAL
  Filled 2011-06-08 (×8): qty 1

## 2011-06-08 MED ORDER — ACETAMINOPHEN 10 MG/ML IV SOLN
INTRAVENOUS | Status: DC | PRN
  Administered 2011-06-08: 1000 mg via INTRAVENOUS

## 2011-06-08 MED ORDER — ACETAMINOPHEN 10 MG/ML IV SOLN
INTRAVENOUS | Status: AC
Start: 2011-06-08 — End: 2011-06-08
  Filled 2011-06-08: qty 100

## 2011-06-08 MED ORDER — CEFUROXIME SODIUM 1.5 G IJ SOLR
INTRAMUSCULAR | Status: DC | PRN
  Administered 2011-06-08: 1.5 g

## 2011-06-08 MED ORDER — ACETAMINOPHEN 10 MG/ML IV SOLN
1000.0000 mg | Freq: Four times a day (QID) | INTRAVENOUS | Status: AC
  Administered 2011-06-08 (×3): 1000 mg via INTRAVENOUS
  Filled 2011-06-08 (×3): qty 100

## 2011-06-08 SURGICAL SUPPLY — 73 items
APL SKNCLS STERI-STRIP NONHPOA (GAUZE/BANDAGES/DRESSINGS) ×2
BANDAGE ELASTIC 4 VELCRO ST LF (GAUZE/BANDAGES/DRESSINGS) ×2 IMPLANT
BANDAGE ELASTIC 6 VELCRO ST LF (GAUZE/BANDAGES/DRESSINGS) ×2 IMPLANT
BANDAGE ESMARK 6X9 LF (GAUZE/BANDAGES/DRESSINGS) ×2 IMPLANT
BENZOIN TINCTURE PRP APPL 2/3 (GAUZE/BANDAGES/DRESSINGS) ×3 IMPLANT
BLADE SAGITTAL 25.0X1.19X90 (BLADE) ×3 IMPLANT
BLADE SAW SAG 90X13X1.27 (BLADE) ×3 IMPLANT
BNDG CMPR 9X6 STRL LF SNTH (GAUZE/BANDAGES/DRESSINGS) ×2
BNDG ESMARK 6X9 LF (GAUZE/BANDAGES/DRESSINGS) ×3
BOWL SMART MIX CTS (DISPOSABLE) ×3 IMPLANT
CEMENT HV SMART SET (Cement) ×6 IMPLANT
CLOTH BEACON ORANGE TIMEOUT ST (SAFETY) ×3 IMPLANT
COVER BACK TABLE 24X17X13 BIG (DRAPES) IMPLANT
COVER SURGICAL LIGHT HANDLE (MISCELLANEOUS) ×3 IMPLANT
CUFF TOURNIQUET SINGLE 34IN LL (TOURNIQUET CUFF) ×3 IMPLANT
CUFF TOURNIQUET SINGLE 44IN (TOURNIQUET CUFF) IMPLANT
DRAPE EXTREMITY T 121X128X90 (DRAPE) ×3 IMPLANT
DRAPE U-SHAPE 47X51 STRL (DRAPES) ×3 IMPLANT
DRSG PAD ABDOMINAL 8X10 ST (GAUZE/BANDAGES/DRESSINGS) ×3 IMPLANT
DURAPREP 26ML APPLICATOR (WOUND CARE) ×3 IMPLANT
ELECT REM PT RETURN 9FT ADLT (ELECTROSURGICAL) ×3
ELECTRODE REM PT RTRN 9FT ADLT (ELECTROSURGICAL) ×2 IMPLANT
EVACUATOR 1/8 PVC DRAIN (DRAIN) ×3 IMPLANT
FACESHIELD LNG OPTICON STERILE (SAFETY) ×3 IMPLANT
GAUZE SPONGE 4X4 12PLY STRL LF (GAUZE/BANDAGES/DRESSINGS) ×2 IMPLANT
GAUZE XEROFORM 5X9 LF (GAUZE/BANDAGES/DRESSINGS) ×3 IMPLANT
GLOVE BIOGEL PI IND STRL 6.5 (GLOVE) ×1 IMPLANT
GLOVE BIOGEL PI IND STRL 7.5 (GLOVE) ×1 IMPLANT
GLOVE BIOGEL PI IND STRL 8 (GLOVE) ×4 IMPLANT
GLOVE BIOGEL PI INDICATOR 6.5 (GLOVE) ×1
GLOVE BIOGEL PI INDICATOR 7.5 (GLOVE) ×1
GLOVE BIOGEL PI INDICATOR 8 (GLOVE) ×2
GLOVE ECLIPSE 7.5 STRL STRAW (GLOVE) ×6 IMPLANT
GLOVE SURG SS PI 6.5 STRL IVOR (GLOVE) ×2 IMPLANT
GLOVE SURG SS PI 7.5 STRL IVOR (GLOVE) ×2 IMPLANT
GOWN PREVENTION PLUS LG XLONG (DISPOSABLE) IMPLANT
GOWN STRL NON-REIN LRG LVL3 (GOWN DISPOSABLE) ×5 IMPLANT
GOWN STRL REIN XL XLG (GOWN DISPOSABLE) ×6 IMPLANT
HANDPIECE INTERPULSE COAX TIP (DISPOSABLE) ×3
HOOD PEEL AWAY FACE SHEILD DIS (HOOD) ×9 IMPLANT
IMMOBILIZER KNEE 20 (SOFTGOODS)
IMMOBILIZER KNEE 20 THIGH 36 (SOFTGOODS) IMPLANT
IMMOBILIZER KNEE 22 UNIV (SOFTGOODS) ×3 IMPLANT
IMMOBILIZER KNEE 24 THIGH 36 (MISCELLANEOUS) IMPLANT
IMMOBILIZER KNEE 24 UNIV (MISCELLANEOUS)
KIT BASIN OR (CUSTOM PROCEDURE TRAY) ×3 IMPLANT
KIT ROOM TURNOVER OR (KITS) ×3 IMPLANT
MANIFOLD NEPTUNE II (INSTRUMENTS) ×3 IMPLANT
NDL HYPO 25GX1X1/2 BEV (NEEDLE) IMPLANT
NEEDLE HYPO 25GX1X1/2 BEV (NEEDLE) IMPLANT
NS IRRIG 1000ML POUR BTL (IV SOLUTION) ×3 IMPLANT
PACK TOTAL JOINT (CUSTOM PROCEDURE TRAY) ×3 IMPLANT
PAD ARMBOARD 7.5X6 YLW CONV (MISCELLANEOUS) ×4 IMPLANT
PAD CAST 4YDX4 CTTN HI CHSV (CAST SUPPLIES) ×2 IMPLANT
PADDING CAST COTTON 4X4 STRL (CAST SUPPLIES) ×3
PADDING WEBRIL 4 STERILE (GAUZE/BANDAGES/DRESSINGS) ×2 IMPLANT
PADDING WEBRIL 6 STERILE (GAUZE/BANDAGES/DRESSINGS) ×2 IMPLANT
SET HNDPC FAN SPRY TIP SCT (DISPOSABLE) ×2 IMPLANT
SPONGE GAUZE 4X4 12PLY (GAUZE/BANDAGES/DRESSINGS) ×3 IMPLANT
STAPLER VISISTAT 35W (STAPLE) ×2 IMPLANT
STRIP CLOSURE SKIN 1/2X4 (GAUZE/BANDAGES/DRESSINGS) ×3 IMPLANT
SUCTION FRAZIER TIP 10 FR DISP (SUCTIONS) ×3 IMPLANT
SUT MON AB 3-0 SH 27 (SUTURE)
SUT MON AB 3-0 SH27 (SUTURE) IMPLANT
SUT VIC AB 0 CTB1 27 (SUTURE) ×6 IMPLANT
SUT VIC AB 1 CT1 27 (SUTURE) ×6
SUT VIC AB 1 CT1 27XBRD ANBCTR (SUTURE) ×4 IMPLANT
SUT VIC AB 2-0 CTB1 (SUTURE) ×6 IMPLANT
SYR CONTROL 10ML LL (SYRINGE) IMPLANT
TOWEL OR 17X24 6PK STRL BLUE (TOWEL DISPOSABLE) ×3 IMPLANT
TOWEL OR 17X26 10 PK STRL BLUE (TOWEL DISPOSABLE) ×3 IMPLANT
TRAY FOLEY CATH 14FR (SET/KITS/TRAYS/PACK) ×3 IMPLANT
WATER STERILE IRR 1000ML POUR (IV SOLUTION) ×5 IMPLANT

## 2011-06-08 NOTE — Brief Op Note (Signed)
06/08/2011  9:17 AM  PATIENT:  Janice Allen  71 y.o. female  PRE-OPERATIVE DIAGNOSIS:  degenerative joint disease  POST-OPERATIVE DIAGNOSIS:  degenerative joint disease  PROCEDURE:  Procedure(s) (LRB): COMPUTER ASSISTED TOTAL KNEE ARTHROPLASTY ()  SURGEON:  Surgeon(s) and Role:    * Harvie Junior, MD - Primary  PHYSICIAN ASSISTANT:   ASSISTANTS: bethune    ANESTHESIA:   general  EBL:  Total I/O In: 1000 [I.V.:1000] Out: 450 [Urine:300; Blood:150]  BLOOD ADMINISTERED:none  DRAINS: (1) Hemovact drain(s) in the r. knee with  Suction Open   LOCAL MEDICATIONS USED:  NONE  SPECIMEN:  No Specimen  DISPOSITION OF SPECIMEN:  N/A  COUNTS:  YES  TOURNIQUET:   Total Tourniquet Time Documented: Thigh (Right) - 71 minutes  DICTATION: .Other Dictation: Dictation Number [id number]  PLAN OF CARE: Admit to inpatient   PATIENT DISPOSITION:  PACU - hemodynamically stable.   Delay start of Pharmacological VTE agent (>24hrs) due to surgical blood loss or risk of bleeding: no

## 2011-06-08 NOTE — Progress Notes (Signed)
Orthopedic Tech Progress Note Patient Details:  Janice Allen 30-Apr-1940 [id number]  CPM Right Knee CPM Right Knee: On Right Knee Flexion (Degrees): 60  Right Knee Extension (Degrees): 0    Gaye Pollack 06/08/2011, 10:43 AM

## 2011-06-08 NOTE — Anesthesia Preprocedure Evaluation (Signed)
Anesthesia Evaluation  Patient identified by MRN, date of birth, ID band Patient awake    Reviewed: Allergy & Precautions, H&P , NPO status , Patient's Chart, lab work & pertinent test results  Airway Mallampati: I TM Distance: >3 FB Neck ROM: Full    Dental  (+) Teeth Intact and Dental Advisory Given   Pulmonary  clear to auscultation        Cardiovascular Regular Normal    Neuro/Psych    GI/Hepatic   Endo/Other    Renal/GU      Musculoskeletal   Abdominal   Peds  Hematology   Anesthesia Other Findings   Reproductive/Obstetrics                           Anesthesia Physical Anesthesia Plan  ASA: II  Anesthesia Plan: General   Post-op Pain Management:    Induction: Intravenous  Airway Management Planned: LMA  Additional Equipment:   Intra-op Plan:   Post-operative Plan:   Informed Consent: I have reviewed the patients History and Physical, chart, labs and discussed the procedure including the risks, benefits and alternatives for the proposed anesthesia with the patient or authorized representative who has indicated his/her understanding and acceptance.   Dental advisory given  Plan Discussed with: CRNA, Anesthesiologist and Surgeon  Anesthesia Plan Comments:         Anesthesia Quick Evaluation

## 2011-06-08 NOTE — Progress Notes (Signed)
ANTICOAGULATION CONSULT NOTE - Initial Consult  Pharmacy Consult for Coumadin Indication: VTE prophylaxis  No Known Allergies  Patient Measurements: Height: 5\' 1"  (154.9 cm) (from preadmit 05/31/11) Weight: 167 lb 8.8 oz (76 kg) (from preadmit 05/31/11) IBW/kg (Calculated) : 47.8   Vital Signs: Temp: 97.8 F (36.6 C) (03/01 1410) Temp src: Oral (03/01 1410) BP: 118/76 mmHg (03/01 1410) Pulse Rate: 72  (03/01 1410)  Labs: No results found for this basename: HGB:2,HCT:3,PLT:3,APTT:3,LABPROT:3,INR:3,HEPARINUNFRC:3,CREATININE:3,CKTOTAL:3,CKMB:3,TROPONINI:3 in the last 72 hours Estimated Creatinine Clearance: 61.9 ml/min (by C-G formula based on Cr of 0.64).  Medical History: Past Medical History  Diagnosis Date  . History of total knee replacement     left total knee replacement  . Heart palpitations 2012    Palpitations after d/c from prior surgery. Pt thinks it was related to anxiety . denies any at this time  . Pneumonia     hx of  . Bronchitis     approx 2 years ago  . Anxiety   . Depression   . Anemia     hx of approx 40 years ago after prior surgery    Medications:  Prescriptions prior to admission  Medication Sig Dispense Refill  . ALPRAZolam (XANAX) 0.5 MG tablet Take 0.0125 mg by mouth at bedtime as needed. For sleep      . Cholecalciferol (VITAMIN D3) 1000 UNITS CAPS Take 1 capsule by mouth daily.      . meloxicam (MOBIC) 15 MG tablet Take 15 mg by mouth daily.      Marland Kitchen OVER THE COUNTER MEDICATION Take 1 capsule by mouth daily. "protandim"      . OVER THE COUNTER MEDICATION Take 1 tablet by mouth 3 (three) times daily. Calcium+magnesium+zinc       Scheduled:    . acetaminophen  1,000 mg Intravenous Q6H  .  ceFAZolin (ANCEF) IV  1 g Intravenous 60 min Pre-Op  .  ceFAZolin (ANCEF) IV  1 g Intravenous Q6H  . cholecalciferol  1,000 Units Oral Daily  . docusate sodium  100 mg Oral BID  . ferrous sulfate  325 mg Oral BID WC  . ketorolac  15 mg Intravenous Q6H  .  morphine   Intravenous Q4H  . DISCONTD: povidone-iodine   Topical Once  . DISCONTD: Vitamin D3  1 capsule Oral Daily    Assessment: 71 y/o female patient admitted s/p R TKA requiring anticoagulation for DVT px. No drug interactions identified.  Goal of Therapy:  INR 2-3   Plan:  Coumadin 5mg  today and f/u daily protime.  Verlene Mayer, PharmD, BCPS Pager [phone] 06/08/2011,2:39 PM

## 2011-06-08 NOTE — Anesthesia Postprocedure Evaluation (Signed)
  Anesthesia Post-op Note  Patient: Janice Allen  Procedure(s) Performed: Procedure(s) (LRB): COMPUTER ASSISTED TOTAL KNEE ARTHROPLASTY ()  Patient Location: PACU  Anesthesia Type: General  Level of Consciousness: awake and alert   Airway and Oxygen Therapy: Patient Spontanous Breathing and Patient connected to nasal cannula oxygen  Post-op Pain: mild  Post-op Assessment: Post-op Vital signs reviewed, Patient's Cardiovascular Status Stable, Respiratory Function Stable, Patent Airway, No signs of Nausea or vomiting and Pain level controlled  Post-op Vital Signs: Reviewed and stable  Complications: No apparent anesthesia complications

## 2011-06-08 NOTE — Transfer of Care (Signed)
Immediate Anesthesia Transfer of Care Note  Patient: Janice Allen  Procedure(s) Performed: Procedure(s) (LRB): COMPUTER ASSISTED TOTAL KNEE ARTHROPLASTY ()  Patient Location: PACU  Anesthesia Type: GA combined with regional for post-op pain  Level of Consciousness: awake, alert , oriented and patient cooperative  Airway & Oxygen Therapy: Patient Spontanous Breathing and Patient connected to nasal cannula oxygen  Post-op Assessment: Report given to PACU RN and Post -op Vital signs reviewed and stable  Post vital signs: Reviewed and stable  Complications: No apparent anesthesia complications

## 2011-06-08 NOTE — Anesthesia Procedure Notes (Addendum)
Performed by: Rossie Muskrat    Procedure Name: Intubation Date/Time: 06/08/2011 7:32 AM Performed by: Rossie Muskrat Pre-anesthesia Checklist: Patient identified, Timeout performed, Emergency Drugs available, Suction available and Patient being monitored Patient Re-evaluated:Patient Re-evaluated prior to inductionOxygen Delivery Method: Circle system utilized Preoxygenation: Pre-oxygenation with 100% oxygen Intubation Type: IV induction Ventilation: Mask ventilation without difficulty Laryngoscope Size: Miller and 2 Grade View: Grade I Tube size: 7.5 mm Number of attempts: 1 Airway Equipment and Method: Stylet Placement Confirmation: ETT inserted through vocal cords under direct vision,  breath sounds checked- equal and bilateral and positive ETCO2 Secured at: 20 cm Tube secured with: Tape Dental Injury: Teeth and Oropharynx as per pre-operative assessment     Anesthesia Regional Block:  Femoral nerve block  Pre-Anesthetic Checklist: ,, timeout performed, Correct Patient, Correct Site, Correct Laterality, Correct Procedure, Correct Position, site marked, Risks and benefits discussed,  Surgical consent,  Pre-op evaluation,  At surgeon's request and post-op pain management  Laterality: Right and Upper  Prep: chloraprep       Needles:  Injection technique: Single-shot  Needle Type: Echogenic Needle     Needle Length: 9cm  Needle Gauge: 22 and 22 G    Additional Needles:  Procedures: ultrasound guided Femoral nerve block Narrative:  Start time: 06/08/2011 7:05 AM End time: 06/08/2011 7:15 AM Injection made incrementally with aspirations every 5 mL.  Performed by: Personally  Anesthesiologist: Sheldon Silvan, MD  Additional Notes: Maraine 0.5% with EPI 1:200000  Femoral nerve block

## 2011-06-08 NOTE — Preoperative (Signed)
Beta Blockers   Reason not to administer Beta Blockers:Pt does not take B Blockers 

## 2011-06-08 NOTE — H&P (Signed)
PREOPERATIVE H&P  Chief Complaint: r. Knee pain  HPI: Janice Allen is a 71 y.o. female who presents for evaluation of r. Knee pain. It has been present for greater than 1 year and has been worsening.  She has bone on bone changes on plain x-ray. She has failed conservative measures. Pain is rated as severe.  Past Medical History  Diagnosis Date  . History of total knee replacement     left total knee replacement  . Heart palpitations 2012    Palpitations after d/c from prior surgery. Pt thinks it was related to anxiety . denies any at this time  . Pneumonia     hx of  . Bronchitis     approx 2 years ago  . Anxiety   . Depression   . Anemia     hx of approx 40 years ago after prior surgery   Past Surgical History  Procedure Date  . Tubal ligation 1980  . Incontinence surgery 2001  . Replacement total knee 2012    left knee   History   Social History  . Marital Status: Divorced    Spouse Name: N/A    Number of Children: N/A  . Years of Education: N/A   Social History Main Topics  . Smoking status: Never Smoker   . Smokeless tobacco: None  . Alcohol Use: No  . Drug Use: No  . Sexually Active:    Other Topics Concern  . None   Social History Narrative  . None   Family History  Problem Relation Age of Onset  . Anesthesia problems Neg Hx   . Hypotension Neg Hx   . Malignant hyperthermia Neg Hx   . Pseudochol deficiency Neg Hx    No Known Allergies Prior to Admission medications   Medication Sig Start Date End Date Taking? Authorizing Provider  ALPRAZolam Prudy Feeler) 0.5 MG tablet Take 0.0125 mg by mouth at bedtime as needed. For sleep   Yes Historical Provider, MD  Cholecalciferol (VITAMIN D3) 1000 UNITS CAPS Take 1 capsule by mouth daily.   Yes Historical Provider, MD  meloxicam (MOBIC) 15 MG tablet Take 15 mg by mouth daily.   Yes Historical Provider, MD  OVER THE COUNTER MEDICATION Take 1 capsule by mouth daily. "protandim"   Yes Historical Provider, MD    OVER THE COUNTER MEDICATION Take 1 tablet by mouth 3 (three) times daily. Calcium+magnesium+zinc   Yes Historical Provider, MD     Positive ROS: none  All other systems have been reviewed and were otherwise negative with the exception of those mentioned in the HPI and as above.  Physical Exam: Filed Vitals:   06/08/11 0601  BP: 138/79  Pulse: 73  Temp: 98.1 F (36.7 C)  Resp: 18    General: Alert, no acute distress Cardiovascular: No pedal edema Respiratory: No cyanosis, no use of accessory musculature GI: No organomegaly, abdomen is soft and non-tender Skin: No lesions in the area of chief complaint Neurologic: Sensation intact distally Psychiatric: Patient is competent for consent with normal mood and affect Lymphatic: No axillary or cervical lymphadenopathy  MUSCULOSKELETAL: limited rom painful rom no instability 2+ distal pulsea  Assessment/Plan: degenerative joint disease Plan for Procedure(s): TOTAL KNEE ARTHROPLASTY with computer assistance  The risks benefits and alternatives were discussed with the patient including but not limited to the risks of nonoperative treatment, versus surgical intervention including infection, bleeding, nerve injury, malunion, nonunion, hardware prominence, hardware failure, need for hardware removal, blood clots, cardiopulmonary complications, morbidity,  mortality, among others, and they were willing to proceed.  Predicted outcome is good, although there will be at least a six to nine month expected recovery.  Emsley Custer L, MD 06/08/2011 7:18 AM

## 2011-06-08 NOTE — Op Note (Signed)
Janice Allen, Janice Allen              ACCOUNT NO.:  [id number]  MEDICAL RECORD NO.:  [id number]  LOCATION:  [facility]                         FACILITY:  [facility]  PHYSICIAN:  Harvie Junior, M.D.   DATE OF BIRTH:  [birth year]  DATE OF PROCEDURE:  06/08/2011 DATE OF DISCHARGE:                              OPERATIVE REPORT   PREOPERATIVE DIAGNOSIS:  End-stage degenerative joint disease, right knee.  POSTOP:  End-stage degenerative joint disease, right knee.  PROCEDURE:  Right total knee replacement with a Sigma system, size 3 femur, size 3 tibia, 10 mm bridging bearing, and a 35 mm all- polyethylene patella.  PROCEDURE:  Computer-assisted right total knee replacement.  SURGEON:  Harvie Junior, M.D.  ASSISTANT:  Marshia Ly, P.A.  ANESTHESIA:  General.  BRIEF HISTORY:  [name] is a 71 year old female with long history of severe bilateral degenerative joint disease of the knee.  We had done a left total knee replacement on her, and she has done great with that, and because of continued complaints of pain in the right knee and failure of all conservative care, she was taken to the operating room for right total knee replacement.  Because of her young age and severe varus malalignment, we felt that computer assistance was appropriate to be used, and this was chosen to be using preoperatively.  She was brought to the operating room for this procedure.  PROCEDURE:  The patient was brought to the operating room.  After adequate anesthesia obtained with general anesthetic, the patient was placed to the operating table.  The right leg was then prepped and draped in the usual sterile fashion.  Once this was completed, attention was turned to the right knee where it was exsanguinated.  Blood pressure tourniquet inflated to 350 mmHg.  Following this, a midline incision was made, subcutaneous tissue down to the level of the extensor mechanism and medial parapatellar arthrotomy was  undertaken.  Once this was completed, the attention was then turned towards the right knee where medial and lateral meniscus was removed as well as retropatellar fat pad and synovium and the anterior aspect of the femur and anterior and posterior cruciate.  Once this was done, attention was turned towards placement of the computer modules; 2 pins in the tibia, 2 pins in the femur.  Once this was completed, the computer arrays were placed. Attention was then turned towards the registration process and this adds 30 minutes of the surgical procedure.  Once this was done, the tibia was cut perpendicular to its long axis.  The femur was cut perpendicular to the anatomic axis and spacer block was put in place with easy full extension at this point.  Attention was then turned towards the femur where the anterior and posterior cuts were made as well as chamfers and box.  Once this was completed, attention was turned towards the tibia which was sized, drilled, and keeled, and osteophytes were removed medially off the tibia.  Once this was completed, attention was turned towards putting in a 10 spacer and the knee put through a range of motion.  Attention was turned to the patella which was sized to a 35 and lugs were drilled.  The lugs drilled in the femur.  The knee put through a range of motion at this point, perfect neutral long alignment gap balanced by computer assistance.  Trial components were removed.  The knee was then copiously and thoroughly lavaged and suctioned dry.  Once this was completely dry, the final components were cemented into place. Size 3 femur, size 3 tibia, 10 mm bridging bearing, and the 10 mm trial bearing was placed and a clamp placed on a 35 mm patella.  The cement was allowed to harden completely and all excess bone cement was removed. Once this was completed, attention was turned towards the tracking of the computer assistance for gap balance and alignment, and this  was fine.  I tried a 12.5 poly just to check, it was a little too tight, could not get into easy full extension.  At this point, the trials were removed in terms of the poly and the final size 10 was placed. Tourniquet was let down prior to this trialing and all bleeding was controlled with electrocautery.  Once this was done, attention was then turned towards the closure and medium Hemovac drain was placed.  The medial parapatellar arthrotomy was closed with 1 Vicryl running, skin with 0 and 2-0 Vicryl and 3-0 Monocryl subcuticular.  Benzoin, Steri- Strips were applied.  Sterile compressive dressing was applied, and the patient was taken to recovery room.  She was noted to be in satisfactory condition.  Estimated blood loss for this procedure was none.     Harvie Junior, M.D.     Ranae Plumber  D:  06/08/2011  T:  06/08/2011  Job:  [id number]

## 2011-06-09 LAB — BASIC METABOLIC PANEL
CO2: 27 mEq/L (ref 19–32)
Calcium: 8.5 mg/dL (ref 8.4–10.5)
GFR calc non Af Amer: 86 mL/min — ABNORMAL LOW (ref >90)
Potassium: 3.7 mEq/L (ref 3.5–5.1)
Sodium: 136 mEq/L (ref 135–145)

## 2011-06-09 LAB — PROTIME-INR
INR: 1.15 (ref 0.00–1.49)
Prothrombin Time: 14.9 seconds (ref 11.6–15.2)

## 2011-06-09 LAB — CBC
MCH: 29.1 pg (ref 26.0–34.0)
Platelets: 133 10*3/uL — ABNORMAL LOW (ref 150–400)
RBC: 3.4 MIL/uL — ABNORMAL LOW (ref 3.87–5.11)

## 2011-06-09 MED ORDER — WARFARIN SODIUM 5 MG PO TABS
5.0000 mg | ORAL_TABLET | Freq: Once | ORAL | Status: AC
  Administered 2011-06-09: 5 mg via ORAL
  Filled 2011-06-09: qty 1

## 2011-06-09 NOTE — Evaluation (Signed)
Physical Therapy Evaluation Patient Details Name: Janice Allen MRN: [id number] DOB: 05/08/1940 Today's Date: 06/09/2011  Problem List:  Patient Active Problem List  Diagnoses  . Osteoarthritis of right knee    Past Medical History:  Past Medical History  Diagnosis Date  . History of total knee replacement     left total knee replacement  . Heart palpitations 2012    Palpitations after d/c from prior surgery. Pt thinks it was related to anxiety . denies any at this time  . Pneumonia     hx of  . Bronchitis     approx 2 years ago  . Anxiety   . Depression   . Anemia     hx of approx 40 years ago after prior surgery   Past Surgical History:  Past Surgical History  Procedure Date  . Tubal ligation 1980  . Incontinence surgery 2001  . Replacement total knee 2012    left knee    PT Assessment/Plan/Recommendation PT Assessment Clinical Impression Statement: Janice Allen is s/p R TKA. Moving well. Unable to d/c home because she lives alone so plan for d/c SNF when ready. Will benefit physical therapy in the acute setting to maximize mobility and strength so as to accelerate rehab process and decrease burden of care at next venue.  PT Recommendation/Assessment: Patient will need skilled PT in the acute care venue PT Problem List: Decreased strength;Decreased range of motion;Decreased activity tolerance;Decreased mobility;Impaired sensation Barriers to Discharge: Decreased caregiver support PT Therapy Diagnosis : Difficulty walking;Abnormality of gait;Generalized weakness PT Plan PT Frequency: 7X/week PT Treatment/Interventions: DME instruction;Gait training;Functional mobility training;Therapeutic activities;Therapeutic exercise;Neuromuscular re-education;Patient/family education PT Recommendation Recommendations for Other Services: OT consult Follow Up Recommendations: Skilled nursing facility Equipment Recommended: Defer to next venue PT Goals  Acute Rehab PT Goals PT  Goal Formulation: With patient Time For Goal Achievement: 7 days Pt will go Supine/Side to Sit: with modified independence PT Goal: Supine/Side to Sit - Progress: Goal set today Pt will go Sit to Supine/Side: with modified independence PT Goal: Sit to Supine/Side - Progress: Goal set today Pt will go Sit to Stand: with modified independence PT Goal: Sit to Stand - Progress: Goal set today Pt will go Stand to Sit: with modified independence PT Goal: Stand to Sit - Progress: Goal set today Pt will Transfer Bed to Chair/Chair to Bed: with modified independence PT Transfer Goal: Bed to Chair/Chair to Bed - Progress: Goal set today Pt will Ambulate: >150 feet;with modified independence;with least restrictive assistive device PT Goal: Ambulate - Progress: Goal set today Pt will Perform Home Exercise Program: Independently PT Goal: Perform Home Exercise Program - Progress: Goal set today  PT Evaluation Precautions/Restrictions  Precautions Required Braces or Orthoses: Yes Knee Immobilizer: On when out of bed or walking Restrictions Weight Bearing Restrictions: Yes RLE Weight Bearing: Weight bearing as tolerated Prior Functioning  Home Living Lives With: Alone Home Adaptive Equipment: Walker - rolling Additional Comments: Pt with plans to d/c to [facility] place prior to d/c home  Prior Function Level of Independence: Independent with basic ADLs;Independent with homemaking with ambulation;Independent with gait;Independent with transfers Driving: Yes Vocation: Retired Producer, television/film/video: Awake/alert Overall Cognitive Status: Appears within functional limits for tasks assessed (a little impulsive) Orientation Level: Oriented X4 Cognition - Other Comments: needs cues to breathe, pt a little impulsive, very talkative Sensation/Coordination Sensation Light Touch: Impaired by gross assessment Additional Comments: R LE still numb from nerve block today Coordination Gross  Motor Movements are Fluid and Coordinated:  Yes Fine Motor Movements are Fluid and Coordinated: Yes Extremity Assessment RUE Assessment RUE Assessment:  (pt c/o weakness bil UE with RW use; fatigues quickly) LUE Assessment LUE Assessment:  (see RUE) RLE AROM (degrees) RLE Overall AROM Comments: Knee flexion grossly 40 degrees with AA otherwise grossly WFL RLE Strength RLE Overall Strength Comments: not tested fully 2/2 recent TKA however unable to perform SLR today LLE Assessment LLE Assessment: Within Functional Limits Mobility (including Balance) Bed Mobility Bed Mobility: Yes Supine to Sit: 5: Supervision;HOB elevated (Comment degrees) (45 degrees) Supine to Sit Details (indicate cue type and reason): min cues for safe technique Sitting - Scoot to Edge of Bed: 6: Modified independent (Device/Increase time) Transfers Transfers: Yes Sit to Stand: 4: Min assist;From elevated surface;From bed;With upper extremity assist Sit to Stand Details (indicate cue type and reason): cues for safe hand placement and follow through to stand Stand to Sit: 4: Min assist;With upper extremity assist;To chair/3-in-1;With armrests Stand to Sit Details: cues for sequencing and assist to contrl descent Ambulation/Gait Ambulation/Gait: Yes Ambulation/Gait Assistance Details (indicate cue type and reason): mingaurdA for amb; cues for RW management and sequencing, upright posture Ambulation Distance (Feet): 14 Feet Assistive device: Rolling walker Gait Pattern: Trunk flexed;Step-to pattern;Decreased weight shift to right;Decreased hip/knee flexion - right (KI on right leg ) Stairs: No  Posture/Postural Control Posture/Postural Control: No significant limitations Balance Balance Assessed: No Exercise  Total Joint Exercises Ankle Circles/Pumps: AROM;Both;10 reps Quad Sets: AAROM;Right;10 reps Heel Slides: AAROM;Right;10 reps Hip ABduction/ADduction: AAROM;Right;10 reps Straight Leg Raises:  (tried but  pt unable today) End of Session PT - End of Session Equipment Utilized During Treatment: Gait belt;Right knee immobilizer Activity Tolerance: Patient tolerated treatment well Patient left: in chair;with call bell in reach;with family/visitor present Nurse Communication: Mobility status for transfers;Mobility status for ambulation General Behavior During Session: [facility] for tasks performed Cognition: [facility] for tasks performed  [facility] HELEN 06/09/2011, 9:17 AM

## 2011-06-09 NOTE — Progress Notes (Signed)
ANTICOAGULATION CONSULT NOTE - follow up Pharmacy Consult for Coumadin Indication: VTE prophylaxis  No Known Allergies  Patient Measurements: Height: 5\' 1"  (154.9 cm) (from preadmit 05/31/11) Weight: 167 lb 8.8 oz (76 kg) (from preadmit 05/31/11) IBW/kg (Calculated) : 47.8   Vital Signs: Temp: 99.4 F (37.4 C) (03/02 0556) BP: 112/56 mmHg (03/02 0556) Pulse Rate: 80  (03/02 0556)  Labs:  Basename 06/09/11 0600  HGB 9.9*  HCT 30.3*  PLT 133*  APTT --  LABPROT 14.9  INR 1.15  HEPARINUNFRC --  CREATININE 0.71  CKTOTAL --  CKMB --  TROPONINI --   Estimated Creatinine Clearance: 61.9 ml/min (by C-G formula based on Cr of 0.71).  Medical History: Past Medical History  Diagnosis Date  . History of total knee replacement     left total knee replacement  . Heart palpitations 2012    Palpitations after d/c from prior surgery. Pt thinks it was related to anxiety . denies any at this time  . Pneumonia     hx of  . Bronchitis     approx 2 years ago  . Anxiety   . Depression   . Anemia     hx of approx 40 years ago after prior surgery    Medications:  Prescriptions prior to admission  Medication Sig Dispense Refill  . ALPRAZolam (XANAX) 0.5 MG tablet Take 0.0125 mg by mouth at bedtime as needed. For sleep      . Cholecalciferol (VITAMIN D3) 1000 UNITS CAPS Take 1 capsule by mouth daily.      . meloxicam (MOBIC) 15 MG tablet Take 15 mg by mouth daily.      Marland Kitchen OVER THE COUNTER MEDICATION Take 1 capsule by mouth daily. "protandim"      . OVER THE COUNTER MEDICATION Take 1 tablet by mouth 3 (three) times daily. Calcium+magnesium+zinc       Scheduled:     . acetaminophen  1,000 mg Intravenous Q6H  .  ceFAZolin (ANCEF) IV  1 g Intravenous Q6H  . cholecalciferol  1,000 Units Oral Daily  . docusate sodium  100 mg Oral BID  . ferrous sulfate  325 mg Oral BID WC  . ketorolac  15 mg Intravenous Q6H  . morphine   Intravenous Q4H  . warfarin  5 mg Oral ONCE-1800  . Warfarin -  Pharmacist Dosing Inpatient   Does not apply q1800  . DISCONTD: povidone-iodine   Topical Once  . DISCONTD: Vitamin D3  1 capsule Oral Daily    Assessment: 71 y/o female patient admitted s/p R TKA requiring anticoagulation for DVT px. No drug interactions identified. INR 1.15  Goal of Therapy:  INR 2-3   Plan:  Coumadin 5mg  today and f/u daily protime.  Verlene Mayer, PharmD, BCPS Pager [phone] 06/09/2011,1:08 PM

## 2011-06-09 NOTE — Progress Notes (Signed)
ANTICOAGULATION CONSULT NOTE - follow up Pharmacy Consult for Coumadin Indication: VTE prophylaxis  No Known Allergies  Patient Measurements: Height: 5\' 1"  (154.9 cm) (from preadmit 05/31/11) Weight: 167 lb 8.8 oz (76 kg) (from preadmit 05/31/11) IBW/kg (Calculated) : 47.8   Vital Signs: Temp: 99.4 F (37.4 C) (03/02 0556) BP: 112/56 mmHg (03/02 0556) Pulse Rate: 80  (03/02 0556)  Labs:  Basename 06/09/11 0600  HGB 9.9*  HCT 30.3*  PLT 133*  APTT --  LABPROT 14.9  INR 1.15  HEPARINUNFRC --  CREATININE 0.71  CKTOTAL --  CKMB --  TROPONINI --   Estimated Creatinine Clearance: 61.9 ml/min (by C-G formula based on Cr of 0.71).  Medical History: Past Medical History  Diagnosis Date  . History of total knee replacement     left total knee replacement  . Heart palpitations 2012    Palpitations after d/c from prior surgery. Pt thinks it was related to anxiety . denies any at this time  . Pneumonia     hx of  . Bronchitis     approx 2 years ago  . Anxiety   . Depression   . Anemia     hx of approx 40 years ago after prior surgery    Medications:  Prescriptions prior to admission  Medication Sig Dispense Refill  . ALPRAZolam (XANAX) 0.5 MG tablet Take 0.0125 mg by mouth at bedtime as needed. For sleep      . Cholecalciferol (VITAMIN D3) 1000 UNITS CAPS Take 1 capsule by mouth daily.      . meloxicam (MOBIC) 15 MG tablet Take 15 mg by mouth daily.      Marland Kitchen OVER THE COUNTER MEDICATION Take 1 capsule by mouth daily. "protandim"      . OVER THE COUNTER MEDICATION Take 1 tablet by mouth 3 (three) times daily. Calcium+magnesium+zinc       Scheduled:     . acetaminophen  1,000 mg Intravenous Q6H  .  ceFAZolin (ANCEF) IV  1 g Intravenous Q6H  . cholecalciferol  1,000 Units Oral Daily  . docusate sodium  100 mg Oral BID  . ferrous sulfate  325 mg Oral BID WC  . ketorolac  15 mg Intravenous Q6H  . morphine   Intravenous Q4H  . warfarin  5 mg Oral ONCE-1800  . Warfarin -  Pharmacist Dosing Inpatient   Does not apply q1800  . DISCONTD: povidone-iodine   Topical Once  . DISCONTD: Vitamin D3  1 capsule Oral Daily    Assessment: 71 y/o female patient admitted s/p R TKA requiring anticoagulation for DVT px. No drug interactions identified. INR 1.15  Goal of Therapy:  INR 2-3   Plan:  Coumadin 5mg  today and f/u daily protime.   Synetta Fail RPh 06/09/2011,1:10 PM

## 2011-06-09 NOTE — Progress Notes (Signed)
Subjective: 1 Day Post-Op Procedure(s) (LRB): COMPUTER ASSISTED TOTAL KNEE ARTHROPLASTY () Patient reports pain as 3 on 0-10 scale.    Objective: Vital signs in last 24 hours: Temp:  [96.8 F (36 C)-99.4 F (37.4 C)] 99.4 F (37.4 C) (03/02 0556) Pulse Rate:  [63-89] 80  (03/02 0556) Resp:  [11-20] 18  (03/02 0556) BP: (112-160)/(56-112) 112/56 mmHg (03/02 0556) SpO2:  [96 %-100 %] 99 % (03/02 0556) FiO2 (%):  [42 %-94 %] 94 % (03/01 1918) Weight:  [76 kg (167 lb 8.8 oz)] 76 kg (167 lb 8.8 oz) (03/01 1310)  Intake/Output from previous day: 03/01 0701 - 03/02 0700 In: 2700 [I.V.:2700] Out: 4250 [Urine:3300; Drains:800; Blood:150] Intake/Output this shift:     Basename 06/09/11 0600  HGB 9.9*    Basename 06/09/11 0600  WBC 6.4  RBC 3.40*  HCT 30.3*  PLT 133*    Basename 06/09/11 0600  NA 136  K 3.7  CL 102  CO2 27  BUN 10  CREATININE 0.71  GLUCOSE 151*  CALCIUM 8.5    Basename 06/09/11 0600  LABPT --  INR 1.15    Neurologically intact ABD soft Neurovascular intact Sensation intact distally Intact pulses distally Dorsiflexion/Plantar flexion intact No cellulitis present Compartment soft  Assessment/Plan: 1 Day Post-Op Procedure(s) (LRB): COMPUTER ASSISTED TOTAL KNEE ARTHROPLASTY () Advance diet Up with therapy Discharge to SNF monday  Denae Zulueta L 06/09/2011, 9:04 AM

## 2011-06-09 NOTE — Progress Notes (Signed)
Physical Therapy Treatment Patient Details Name: VIRGINA DEAKINS MRN: [id number] DOB: 04-Jul-1940 Today's Date: 06/09/2011  PT Assessment/Plan  PT - Assessment/Plan Comments on Treatment Session: Increased gait distance this PM.  Pt very motivated and cooperative. PT Plan: Discharge plan remains appropriate PT Frequency: 7X/week Recommendations for Other Services: OT consult Follow Up Recommendations: Skilled nursing facility Equipment Recommended: Defer to next venue PT Goals  Acute Rehab PT Goals PT Goal: Supine/Side to Sit - Progress: Progressing toward goal PT Goal: Sit to Supine/Side - Progress: Progressing toward goal PT Goal: Sit to Stand - Progress: Progressing toward goal PT Goal: Stand to Sit - Progress: Progressing toward goal PT Transfer Goal: Bed to Chair/Chair to Bed - Progress: Progressing toward goal PT Goal: Ambulate - Progress: Progressing toward goal PT Goal: Perform Home Exercise Program - Progress: Progressing toward goal  PT Treatment Precautions/Restrictions  Precautions Required Braces or Orthoses: Yes Knee Immobilizer: On when out of bed or walking Restrictions Weight Bearing Restrictions: Yes RLE Weight Bearing: Weight bearing as tolerated Mobility (including Balance) Bed Mobility Supine to Sit: 5: Supervision;HOB elevated (Comment degrees);With rails (45 degrees) Transfers Sit to Stand: 4: Min assist;From bed;With upper extremity assist Sit to Stand Details (indicate cue type and reason): verbal cues for hand placement Stand to Sit: 4: Min assist;To chair/3-in-1;With armrests Stand to Sit Details: verbal cues for sequencing Ambulation/Gait Ambulation/Gait Assistance: 4: Min assist Ambulation/Gait Assistance Details (indicate cue type and reason): min guard assist, verbal cues for RW management Ambulation Distance (Feet): 25 Feet Assistive device: Rolling walker Gait Pattern: Step-to pattern;Decreased stance time - right Gait velocity: decreased    Exercise    End of Session PT - End of Session Equipment Utilized During Treatment: Gait belt;Right knee immobilizer Activity Tolerance: Patient tolerated treatment well Patient left: in chair;with call bell in reach General Behavior During Session: [facility] for tasks performed Cognition: [facility] for tasks performed  Ilda Foil 06/09/2011, 2:50 PM  Aida Raider, PT  Office # [phone] Pager [phone]

## 2011-06-09 NOTE — Progress Notes (Signed)
CSW received consult for SNF. Met with pt who reports pre-registered with [facility]. Pt with [employer] which requires prior auth for SNF. Auth initiated today. Hopeful for Monday d/c. See chart for full eval and FL2.  Dellie Burns, MSW, [place] [phone] (weekend)

## 2011-06-10 LAB — CBC
MCH: 29 pg (ref 26.0–34.0)
MCHC: 33.4 g/dL (ref 30.0–36.0)
Platelets: 156 10*3/uL (ref 150–400)
RDW: 13.8 % (ref 11.5–15.5)

## 2011-06-10 LAB — PROTIME-INR
INR: 1.33 (ref 0.00–1.49)
Prothrombin Time: 16.7 seconds — ABNORMAL HIGH (ref 11.6–15.2)

## 2011-06-10 MED ORDER — BISACODYL 10 MG RE SUPP
10.0000 mg | Freq: Once | RECTAL | Status: AC
  Administered 2011-06-10: 10 mg via RECTAL

## 2011-06-10 MED ORDER — HYDROCODONE-ACETAMINOPHEN 5-325 MG PO TABS
1.0000 | ORAL_TABLET | ORAL | Status: DC | PRN
  Administered 2011-06-10 (×2): 1 via ORAL
  Administered 2011-06-10 – 2011-06-11 (×5): 2 via ORAL
  Filled 2011-06-10: qty 1
  Filled 2011-06-10: qty 2
  Filled 2011-06-10: qty 1
  Filled 2011-06-10 (×4): qty 2

## 2011-06-10 MED ORDER — WARFARIN SODIUM 5 MG PO TABS
5.0000 mg | ORAL_TABLET | Freq: Once | ORAL | Status: AC
  Administered 2011-06-10: 5 mg via ORAL
  Filled 2011-06-10: qty 1

## 2011-06-10 NOTE — Progress Notes (Signed)
Physical Therapy Treatment Patient Details Name: Janice Allen MRN: [id number] DOB: [birth year] Today's Date: 06/10/2011  PT Assessment/Plan  PT - Assessment/Plan Comments on Treatment Session: Pt is progressing well. Plan is for d/c to [facility]. PT Plan: Discharge plan remains appropriate;Frequency remains appropriate PT Frequency: 7X/week Follow Up Recommendations: Skilled nursing facility Equipment Recommended: Defer to next venue PT Goals  Acute Rehab PT Goals PT Goal: Supine/Side to Sit - Progress: Progressing toward goal PT Goal: Sit to Supine/Side - Progress: Progressing toward goal PT Goal: Sit to Stand - Progress: Progressing toward goal PT Goal: Stand to Sit - Progress: Progressing toward goal PT Transfer Goal: Bed to Chair/Chair to Bed - Progress: Progressing toward goal PT Goal: Ambulate - Progress: Progressing toward goal PT Goal: Perform Home Exercise Program - Progress: Progressing toward goal  PT Treatment Precautions/Restrictions  Precautions Precautions: Knee Required Braces or Orthoses: Yes Knee Immobilizer: On when out of bed or walking Restrictions Weight Bearing Restrictions: Yes RLE Weight Bearing: Weight bearing as tolerated Mobility (including Balance) Bed Mobility Supine to Sit: 5: Supervision;HOB flat Transfers Sit to Stand: 4: Min assist;From bed;With upper extremity assist Sit to Stand Details (indicate cue type and reason): verbal cues for hand placement Stand to Sit: 4: Min assist;To chair/3-in-1;With armrests Stand to Sit Details: verbal cues for sequencing Ambulation/Gait Ambulation/Gait Assistance: 4: Min assist Ambulation/Gait Assistance Details (indicate cue type and reason): min guard assist, verbal cues for posture, RW management Ambulation Distance (Feet): 75 Feet Assistive device: Rolling walker Gait Pattern: Decreased stance time - right;Step-through pattern;Antalgic Gait velocity: decreased    Exercise  Total Joint  Exercises Ankle Circles/Pumps: AROM;Both;20 reps Quad Sets: AROM;Right;10 reps Short Arc QuadBarbaraann Boys;Right;10 reps Heel Slides: AAROM;Right;10 reps Hip ABduction/ADduction: AAROM;Right;10 reps Knee Flexion: AAROM;Right (5-75 degrees) End of Session PT - End of Session Equipment Utilized During Treatment: Gait belt;Right knee immobilizer Activity Tolerance: Patient tolerated treatment well Patient left: in chair;with call bell in reach;with family/visitor present General Behavior During Session: [facility] for tasks performed Cognition: [facility] for tasks performed  Ilda Foil 06/10/2011, 11:52 AM  Aida Raider, PT  Office # [phone] Pager [phone]

## 2011-06-10 NOTE — Progress Notes (Signed)
ANTICOAGULATION CONSULT NOTE - follow up Pharmacy Consult for Coumadin Indication: VTE prophylaxis  No Known Allergies  Patient Measurements: Height: 5\' 1"  (154.9 cm) (from preadmit 05/31/11) Weight: 167 lb 8.8 oz (76 kg) (from preadmit 05/31/11) IBW/kg (Calculated) : 47.8   Vital Signs: Temp: 99.1 F (37.3 C) (03/03 0550) Temp src: Oral (03/03 0550) BP: 124/59 mmHg (03/03 0525) Pulse Rate: 101  (03/03 0525)  Labs:  Basename 06/10/11 0525 06/09/11 0600  HGB 9.7* 9.9*  HCT 29.0* 30.3*  PLT 156 133*  APTT -- --  LABPROT 16.7* 14.9  INR 1.33 1.15  HEPARINUNFRC -- --  CREATININE -- 0.71  CKTOTAL -- --  CKMB -- --  TROPONINI -- --   Estimated Creatinine Clearance: 61.9 ml/min (by C-G formula based on Cr of 0.71).  Medical History: Past Medical History  Diagnosis Date  . History of total knee replacement     left total knee replacement  . Heart palpitations 2012    Palpitations after d/c from prior surgery. Pt thinks it was related to anxiety . denies any at this time  . Pneumonia     hx of  . Bronchitis     approx 2 years ago  . Anxiety   . Depression   . Anemia     hx of approx 40 years ago after prior surgery    Medications:  Prescriptions prior to admission  Medication Sig Dispense Refill  . ALPRAZolam (XANAX) 0.5 MG tablet Take 0.0125 mg by mouth at bedtime as needed. For sleep      . Cholecalciferol (VITAMIN D3) 1000 UNITS CAPS Take 1 capsule by mouth daily.      . meloxicam (MOBIC) 15 MG tablet Take 15 mg by mouth daily.      Marland Kitchen OVER THE COUNTER MEDICATION Take 1 capsule by mouth daily. "protandim"      . OVER THE COUNTER MEDICATION Take 1 tablet by mouth 3 (three) times daily. Calcium+magnesium+zinc       Scheduled:     . cholecalciferol  1,000 Units Oral Daily  . docusate sodium  100 mg Oral BID  . ferrous sulfate  325 mg Oral BID WC  . morphine   Intravenous Q4H  . warfarin  5 mg Oral ONCE-1800  . Warfarin - Pharmacist Dosing Inpatient   Does not  apply q1800    Assessment: 71 y/o female patient admitted s/p R TKA requiring anticoagulation for DVT px. No drug interactions identified. INR 1.33  After 2 days of 5 mg  Will repeat 5mg  coumadin today F/u AM level  Goal of Therapy:  INR 2-3   Plan:  Coumadin 5mg  today and f/u daily protime.   Synetta Fail RPh 06/10/2011,2:29 PM

## 2011-06-10 NOTE — Progress Notes (Addendum)
Subjective: 2 Days Post-Op Procedure(s) (LRB): COMPUTER ASSISTED TOTAL KNEE ARTHROPLASTY () Patient reports pain as 3 on 0-10 scale.   Taking po/voiding OK Progressing with PT  Objective: Vital signs in last 24 hours: Temp:  [98.4 F (36.9 C)-99.3 F (37.4 C)] 99.1 F (37.3 C) (03/03 0550) Pulse Rate:  [86-101] 101  (03/03 0525) Resp:  [16-18] 18  (03/03 0525) BP: (108-124)/(48-59) 124/59 mmHg (03/03 0525) SpO2:  [95 %-100 %] 96 % (03/03 0525)  Intake/Output from previous day: 03/02 0701 - 03/03 0700 In: 720 [P.O.:720] Out: 300 [Drains:300] Intake/Output this shift:     Basename 06/10/11 0525 06/09/11 0600  HGB 9.7* 9.9*    Basename 06/10/11 0525 06/09/11 0600  WBC 9.5 6.4  RBC 3.34* 3.40*  HCT 29.0* 30.3*  PLT 156 133*    Basename 06/09/11 0600  NA 136  K 3.7  CL 102  CO2 27  BUN 10  CREATININE 0.71  GLUCOSE 151*  CALCIUM 8.5    Basename 06/10/11 0525 06/09/11 0600  LABPT -- --  INR 1.33 1.15  Right knee exam:  Neurovascular intact Sensation intact distally Intact pulses distally Dorsiflexion/Plantar flexion intact Incision: no drainage  Assessment/Plan: 2 Days Post-Op Procedure(s) (LRB): COMPUTER ASSISTED TOTAL KNEE ARTHROPLASTY () Up with therapy Plan for discharge tomorrow to SNF Dressing changed.Drain pulled.  Janice Allen 06/10/2011, 10:50 AM

## 2011-06-11 LAB — PROTIME-INR
INR: 1.49 (ref 0.00–1.49)
Prothrombin Time: 18.3 seconds — ABNORMAL HIGH (ref 11.6–15.2)

## 2011-06-11 LAB — CBC
MCH: 28.4 pg (ref 26.0–34.0)
MCHC: 32.4 g/dL (ref 30.0–36.0)
RDW: 13.9 % (ref 11.5–15.5)

## 2011-06-11 MED ORDER — WARFARIN SODIUM 5 MG PO TABS: ORAL_TABLET | ORAL | Status: DC

## 2011-06-11 MED ORDER — WARFARIN SODIUM 5 MG PO TABS
5.0000 mg | ORAL_TABLET | Freq: Once | ORAL | Status: DC
  Filled 2011-06-11: qty 1

## 2011-06-11 MED ORDER — METHOCARBAMOL 500 MG PO TABS: 500.0000 mg | ORAL_TABLET | Freq: Four times a day (QID) | ORAL | Status: AC | PRN

## 2011-06-11 MED ORDER — HYDROCODONE-ACETAMINOPHEN 5-325 MG PO TABS: 1.0000 | ORAL_TABLET | ORAL | Status: AC | PRN

## 2011-06-11 NOTE — Progress Notes (Signed)
Physical Therapy Treatment Patient Details Name: Janice Allen MRN: [id number] DOB: 1941-02-11 Today's Date: 06/11/2011  PT Assessment/Plan  PT - Assessment/Plan Comments on Treatment Session: Continued progress, should do well at rehab PT Plan: Discharge plan remains appropriate;Frequency remains appropriate PT Frequency: 7X/week Follow Up Recommendations: Skilled nursing facility Equipment Recommended: Defer to next venue PT Goals  Acute Rehab PT Goals Time For Goal Achievement: 7 days Pt will go Supine/Side to Sit: with modified independence PT Goal: Supine/Side to Sit - Progress: Progressing toward goal Pt will go Sit to Supine/Side: with modified independence PT Goal: Sit to Supine/Side - Progress: Progressing toward goal Pt will go Sit to Stand: with modified independence PT Goal: Sit to Stand - Progress: Progressing toward goal Pt will go Stand to Sit: with modified independence PT Goal: Stand to Sit - Progress: Progressing toward goal Pt will Transfer Bed to Chair/Chair to Bed: with modified independence PT Transfer Goal: Bed to Chair/Chair to Bed - Progress: Other (comment) Pt will Ambulate: >150 feet;with modified independence;with least restrictive assistive device PT Goal: Ambulate - Progress: Progressing toward goal Pt will Perform Home Exercise Program: Independently PT Goal: Perform Home Exercise Program - Progress: Other (comment)  PT Treatment Precautions/Restrictions  Precautions Precautions: Knee Required Braces or Orthoses: Yes Knee Immobilizer: On when out of bed or walking Restrictions Weight Bearing Restrictions: Yes RLE Weight Bearing: Weight bearing as tolerated Mobility (including Balance) Bed Mobility Supine to Sit: 5: Supervision;HOB flat Supine to Sit Details (indicate cue type and reason): min cues for safe technique Sitting - Scoot to Edge of Bed: 6: Modified independent (Device/Increase time) Transfers Transfers: Yes Sit to Stand: 4: Min  assist;From bed (guard assist 1) Sit to Stand Details (indicate cue type and reason): mingueard assist without physical assist, cues for positioning of RLE and safe hand placement Stand to Sit: 4: Min assist;To chair/3-in-1;To toilet Stand to Sit Details: cues to control descent Ambulation/Gait Ambulation/Gait: Yes Ambulation/Gait Assistance: 4: Min assist (guard assist witout physical cntact) Ambulation/Gait Assistance Details (indicate cue type and reason): Overall pretty smooth amb; cues for safe use of RW as pt at time reaches for sink and door; cued pt to be very careful whenever just one hand is on RW as there is a risk for tipping Ambulation Distance (Feet): 85 Feet Assistive device: Rolling walker Gait Pattern: Step-to pattern Gait velocity: decreased Stairs: No  Posture/Postural Control Posture/Postural Control: No significant limitations Exercise   Deferred secondary to eminent need for restroom End of Session PT - End of Session Equipment Utilized During Treatment: Gait belt;Right knee immobilizer Activity Tolerance: Patient tolerated treatment well Patient left: in chair;with call bell in reach;with family/visitor present Nurse Communication: Mobility status for transfers;Mobility status for ambulation General Behavior During Session: [facility] for tasks performed Cognition: [facility] for tasks performed  Van Clines [facility] [place], [place] [phone]  06/11/2011, 9:42 AM

## 2011-06-11 NOTE — Progress Notes (Signed)
Subjective: 3 Days Post-Op Procedure(s) (LRB): COMPUTER ASSISTED TOTAL KNEE ARTHROPLASTY () Patient reports pain as 2 on 0-10 scale.   Taking po/voiding ok.  Objective: Vital signs in last 24 hours: Temp:  [98.1 F (36.7 C)-98.7 F (37.1 C)] 98.7 F (37.1 C) (03/04 0550) Pulse Rate:  [94-99] 94  (03/04 0550) Resp:  [16-18] 16  (03/04 0550) BP: (103-123)/(47-61) 123/56 mmHg (03/04 0550) SpO2:  [96 %-97 %] 97 % (03/04 0550)  Intake/Output from previous day: 03/03 0701 - 03/04 0700 In: 960 [P.O.:960] Out: -  Intake/Output this shift:     Basename 06/11/11 0610 06/10/11 0525 06/09/11 0600  HGB 9.0* 9.7* 9.9*    Basename 06/11/11 0610 06/10/11 0525  WBC 8.6 9.5  RBC 3.17* 3.34*  HCT 27.8* 29.0*  PLT 158 156    Basename 06/09/11 0600  NA 136  K 3.7  CL 102  CO2 27  BUN 10  CREATININE 0.71  GLUCOSE 151*  CALCIUM 8.5    Basename 06/11/11 0610 06/10/11 0525  LABPT -- --  INR 1.49 1.33  Right knee exam:  Neurovascular intact Sensation intact distally Intact pulses distally Incision: dressing C/D/I Compartment soft  Assessment/Plan: 3 Days Post-Op Procedure(s) (LRB): COMPUTER ASSISTED TOTAL KNEE ARTHROPLASTY ()Right knee Plan: D/C IV fluids Discharge to SNF  Saifullah Jolley G 06/11/2011, 8:22 AM

## 2011-06-11 NOTE — Progress Notes (Signed)
Clinical Social Work-CSW received and forwarded partners authorization-assembled d/c packet and notified [facility] of d/c-dtr is to provide transportation-no further needs. Jodean Lima [phone]

## 2011-06-11 NOTE — Progress Notes (Signed)
ANTICOAGULATION CONSULT NOTE - follow up Pharmacy Consult for Coumadin Indication: VTE prophylaxis  No Known Allergies  Patient Measurements: Height: 5\' 1"  (154.9 cm) (from preadmit 05/31/11) Weight: 167 lb 8.8 oz (76 kg) (from preadmit 05/31/11) IBW/kg (Calculated) : 47.8   Vital Signs: Temp: 98.7 F (37.1 C) (03/04 0550) Temp src: Oral (03/04 0550) BP: 123/56 mmHg (03/04 0550) Pulse Rate: 94  (03/04 0550)  Labs:  Basename 06/11/11 0610 06/10/11 0525 06/09/11 0600  HGB 9.0* 9.7* --  HCT 27.8* 29.0* 30.3*  PLT 158 156 133*  APTT -- -- --  LABPROT 18.3* 16.7* 14.9  INR 1.49 1.33 1.15  HEPARINUNFRC -- -- --  CREATININE -- -- 0.71  CKTOTAL -- -- --  CKMB -- -- --  TROPONINI -- -- --   Estimated Creatinine Clearance: 61.9 ml/min (by C-G formula based on Cr of 0.71).  Medical History: Past Medical History  Diagnosis Date  . History of total knee replacement     left total knee replacement  . Heart palpitations 2012    Palpitations after d/c from prior surgery. Pt thinks it was related to anxiety . denies any at this time  . Pneumonia     hx of  . Bronchitis     approx 2 years ago  . Anxiety   . Depression   . Anemia     hx of approx 40 years ago after prior surgery    Medications:  Prescriptions prior to admission  Medication Sig Dispense Refill  . ALPRAZolam (XANAX) 0.5 MG tablet Take 0.0125 mg by mouth at bedtime as needed. For sleep      . Cholecalciferol (VITAMIN D3) 1000 UNITS CAPS Take 1 capsule by mouth daily.      Marland Kitchen OVER THE COUNTER MEDICATION Take 1 capsule by mouth daily. "protandim"      . OVER THE COUNTER MEDICATION Take 1 tablet by mouth 3 (three) times daily. Calcium+magnesium+zinc      . DISCONTD: meloxicam (MOBIC) 15 MG tablet Take 15 mg by mouth daily.       Scheduled:     . bisacodyl  10 mg Rectal Once  . cholecalciferol  1,000 Units Oral Daily  . docusate sodium  100 mg Oral BID  . ferrous sulfate  325 mg Oral BID WC  . warfarin  5 mg  Oral ONCE-1800  . Warfarin - Pharmacist Dosing Inpatient   Does not apply q1800  . DISCONTD: morphine   Intravenous Q4H    Assessment: 71 y/o female patient admitted s/p R TKA requiring anticoagulation for DVT px. INR up to 1.49. Patient on FESO4 for post-op anemia with Hgb down to 9.  Goal of Therapy:  INR 2-3   Plan:  Coumadin 5mg  today.  Misty Stanley, PharmD, BCPS  06/11/2011,10:18 AM

## 2011-06-11 NOTE — Discharge Summary (Signed)
Patient ID: Janice Allen MRN: [id number] DOB/AGE: February 11, 1941 71 y.o.  Admit date: 06/08/2011 Discharge date: 06/11/2011  Admission Diagnoses:  Principal Problem:  *Osteoarthritis of right knee   Discharge Diagnoses:  Same  Past Medical History  Diagnosis Date  . History of total knee replacement     left total knee replacement  . Heart palpitations 2012    Palpitations after d/c from prior surgery. Pt thinks it was related to anxiety . denies any at this time  . Pneumonia     hx of  . Bronchitis     approx 2 years ago  . Anxiety   . Depression   . Anemia     hx of approx 40 years ago after prior surgery    Surgeries: Procedure(s):Right COMPUTER ASSISTED TOTAL KNEE ARTHROPLASTY on 06/08/2011    Discharged Condition: Improved  Hospital Course: Janice Allen is an 71 y.o. female who was admitted 06/08/2011 for operative treatment ofOsteoarthritis of right knee. Patient has severe unremitting pain that affects sleep, daily activities, and work/hobbies. After pre-op clearance the patient was taken to the operating room on 06/08/2011 and underwent  Procedure(s): COMPUTER ASSISTED TOTAL KNEE ARTHROPLASTY.    Patient was given perioperative antibiotics: Anti-infectives     Start     Dose/Rate Route Frequency Ordered Stop   06/08/11 1400   ceFAZolin (ANCEF) IVPB 1 g/50 mL premix        1 g 100 mL/hr over 30 Minutes Intravenous Every 6 hours 06/08/11 1349 06/09/11 0224   06/08/11 0759   cefUROXime (ZINACEF) injection  Status:  Discontinued          As needed 06/08/11 0800 06/08/11 0938   06/07/11 1415   ceFAZolin (ANCEF) IVPB 1 g/50 mL premix        1 g 100 mL/hr over 30 Minutes Intravenous 60 min pre-op 06/07/11 1402 06/08/11 0740           Patient was given sequential compression devices, early ambulation, and chemoprophylaxis to prevent DVT.  Patient benefited maximally from hospital stay and there were no complications.    Recent vital signs: Patient Vitals for the  past 24 hrs:  BP Temp Temp src Pulse Resp SpO2  06/11/11 0550 123/56 mmHg 98.7 F (37.1 C) Oral 94  16  97 %  June 24, 2011 2026 111/61 mmHg 98.6 F (37 C) Oral 99  18  97 %  06/24/2011 1654 103/47 mmHg 98.1 F (36.7 C) Oral 98  18  96 %     Recent laboratory studies:  Basename 06/11/11 0610 2011/06/24 0525 06/09/11 0600  WBC 8.6 9.5 --  HGB 9.0* 9.7* --  HCT 27.8* 29.0* --  PLT 158 156 --  NA -- -- 136  K -- -- 3.7  CL -- -- 102  CO2 -- -- 27  BUN -- -- 10  CREATININE -- -- 0.71  GLUCOSE -- -- 151*  INR 1.49 1.33 --  CALCIUM -- -- 8.5     Discharge Medications:   Medication List  As of 06/11/2011  8:30 AM   STOP taking these medications         meloxicam 15 MG tablet         TAKE these medications         ALPRAZolam 0.5 MG tablet   Commonly known as: XANAX   Take 0.0125 mg by mouth at bedtime as needed. For sleep      HYDROcodone-acetaminophen 5-325 MG per tablet   Commonly known as:  NORCO   Take 1-2 tablets by mouth every 4 (four) hours as needed.      methocarbamol 500 MG tablet   Commonly known as: ROBAXIN   Take 1 tablet (500 mg total) by mouth every 6 (six) hours as needed.      OVER THE COUNTER MEDICATION   Take 1 capsule by mouth daily. "protandim"      OVER THE COUNTER MEDICATION   Take 1 tablet by mouth 3 (three) times daily. Calcium+magnesium+zinc      Vitamin D3 1000 UNITS Caps   Take 1 capsule by mouth daily.      warfarin 5 MG tablet   Commonly known as: COUMADIN   One daily unless otherwise directed by pharmacy x 1 month. Shoot for INR of 2.0.            Diagnostic Studies: Dg Chest 2 View  05/31/2011  *RADIOLOGY REPORT*  Clinical Data: Preadmission  CHEST - 2 VIEW  Comparison: 10/13/2010 and 10/02/2010  Findings: Cardiomediastinal silhouette is stable.  No acute infiltrate or pleural effusion.  No pulmonary edema.  Stable mild degenerative changes mid thoracic spine.  IMPRESSION: No active disease.  No significant change.  Original Report  Authenticated By: Natasha Mead, M.D.    Disposition: 01-Home or Self Care  Discharge Orders    Future Orders Please Complete By Expires   Diet general      Weight Bearing as taught in Physical Therapy      Scheduling Instructions:   WBAT on Right.  Daily PT   Comments:   Use a walker or crutches as instructed.   CPM      Comments:   Continuous passive motion machine (CPM):      Use the CPM from 0 to 70 degrees for 8 hours per day.      You may increase by 5-10 degrees per day.  You may break it up into 2 or 3 sessions per day.      Use CPM for 1-2 weeks or until you are told to stop.   TED hose      Comments:   Use stockings (TED hose) for 2 weeks on both leg(s).  You may remove them at night for sleeping.   Change dressing      Comments:   Change dressing on  Every 2 days, then change the dressing  with sterile 4 x 4 inch gauze dressing and apply TED hose.      Follow-up Information    Follow up with GRAVES,JOHN L, MD. Schedule an appointment as soon as possible for a visit in 10 days.   Contact information:   [address] [place] [address] [phone]           Signed: Matthew Folks 06/11/2011, 8:30 AM

## 2011-06-12 ENCOUNTER — Encounter (HOSPITAL_COMMUNITY): Payer: Self-pay | Admitting: Orthopedic Surgery

## 2012-02-20 ENCOUNTER — Other Ambulatory Visit: Payer: Self-pay | Admitting: Family Medicine

## 2012-02-20 DIAGNOSIS — R911 Solitary pulmonary nodule: Secondary | ICD-10-CM

## 2012-02-22 ENCOUNTER — Ambulatory Visit
Admission: RE | Admit: 2012-02-22 | Discharge: 2012-02-22 | Disposition: A | Discharge status: Home / Self Care | Payer: Medicare Other | Source: Ambulatory Visit | Attending: Family Medicine | Admitting: Family Medicine

## 2012-02-22 DIAGNOSIS — R911 Solitary pulmonary nodule: Secondary | ICD-10-CM

## 2012-02-22 MED ORDER — IOHEXOL 300 MG/ML  SOLN
75.0000 mL | Freq: Once | INTRAMUSCULAR | Status: AC | PRN
  Administered 2012-02-22: 75 mL via INTRAVENOUS

## 2012-10-30 ENCOUNTER — Other Ambulatory Visit: Payer: Self-pay | Admitting: Family Medicine

## 2012-10-30 ENCOUNTER — Other Ambulatory Visit (HOSPITAL_COMMUNITY)
Admission: RE | Admit: 2012-10-30 | Discharge: 2012-10-30 | Disposition: A | Discharge status: Home / Self Care | Payer: Medicare Other | Source: Ambulatory Visit | Attending: Family Medicine | Admitting: Family Medicine

## 2012-10-30 DIAGNOSIS — Z124 Encounter for screening for malignant neoplasm of cervix: Secondary | ICD-10-CM | POA: Insufficient documentation

## 2014-11-25 ENCOUNTER — Other Ambulatory Visit: Payer: Self-pay | Admitting: Family Medicine

## 2014-11-25 DIAGNOSIS — R109 Unspecified abdominal pain: Secondary | ICD-10-CM

## 2014-11-25 DIAGNOSIS — R102 Pelvic and perineal pain: Secondary | ICD-10-CM

## 2014-11-30 ENCOUNTER — Ambulatory Visit
Admission: RE | Admit: 2014-11-30 | Discharge: 2014-11-30 | Disposition: A | Discharge status: Home / Self Care | Payer: Medicare Other | Source: Ambulatory Visit | Attending: Family Medicine | Admitting: Family Medicine

## 2014-11-30 DIAGNOSIS — R109 Unspecified abdominal pain: Secondary | ICD-10-CM

## 2014-11-30 DIAGNOSIS — R102 Pelvic and perineal pain unspecified side: Secondary | ICD-10-CM

## 2016-03-08 ENCOUNTER — Ambulatory Visit
Admission: RE | Admit: 2016-03-08 | Discharge: 2016-03-08 | Disposition: A | Discharge status: Home / Self Care | Payer: Medicare Other | Source: Ambulatory Visit | Attending: Family Medicine | Admitting: Family Medicine

## 2016-03-08 ENCOUNTER — Other Ambulatory Visit: Payer: Self-pay | Admitting: Family Medicine

## 2016-03-08 DIAGNOSIS — M545 Low back pain: Secondary | ICD-10-CM

## 2016-07-04 NOTE — Congregational Nurse Program (Signed)
Congregational Nurse Program Note  Date of Encounter: 07/04/2016  Past Medical History: Past Medical History:  Diagnosis Date  . Anemia    hx of approx 40 years ago after prior surgery  . Anxiety   . Bronchitis    approx 2 years ago  . Depression   . Heart palpitations 2012   Palpitations after d/c from prior surgery. Pt thinks it was related to anxiety . denies any at this time  . History of total knee replacement    left total knee replacement  . Pneumonia    hx of    Encounter Details:     CNP Questionnaire - 07/04/16 1726      Patient Demographics   Is this a new or existing patient? New   Patient is considered a/an Not Applicable   Race Caucasian/White     Patient Assistance   Location of Patient Assistance Not Applicable   Patient's financial/insurance status Medicare   Uninsured Patient ([facility] [employer] Connects) No   Patient referred to apply for the following financial assistance Not Applicable   Food insecurities addressed Not Applicable   Transportation assistance No   Assistance securing medications No   Educational health offerings Exercise/physical activity     Encounter Details   Primary purpose of visit Education/Health Concerns   Was an Emergency Department visit averted? Not Applicable   Does patient have a medical provider? No   Patient referred to Not Applicable   Was a mental health screening completed? (GAINS tool) No   Does patient have dental issues? No   Does patient have vision issues? No   Does your patient have an abnormal blood pressure today? No   Since previous encounter, have you referred patient for abnormal blood pressure that resulted in a new diagnosis or medication change? No   Does your patient have an abnormal blood glucose today? No   Since previous encounter, have you referred patient for abnormal blood glucose that resulted in a new diagnosis or medication change? No   Was there a life-saving intervention made? No     Joined chair yoga group.

## 2016-09-11 NOTE — Congregational Nurse Program (Signed)
Congregational Nurse Program Note  Date of Encounter: 09/11/2016  Past Medical History: Past Medical History:  Diagnosis Date  . Anemia    hx of approx 40 years ago after prior surgery  . Anxiety   . Bronchitis    approx 2 years ago  . Depression   . Heart palpitations 2012   Palpitations after d/c from prior surgery. Pt thinks it was related to anxiety . denies any at this time  . History of total knee replacement    left total knee replacement  . Pneumonia    hx of    Encounter Details:     CNP Questionnaire - 09/11/16 0214      Patient Demographics   Is this a new or existing patient? New   Patient is considered a/an Not Applicable   Race Caucasian/White     Patient Assistance   Location of Patient Assistance Not Applicable   Patient's financial/insurance status Medicare   Uninsured Patient ([employer] Connects) No   Patient referred to apply for the following financial assistance Not Applicable   Food insecurities addressed Not Applicable   Transportation assistance No   Assistance securing medications No   Educational health offerings Exercise/physical activity     Encounter Details   Primary purpose of visit Education/Health Concerns   Was an Emergency Department visit averted? Not Applicable   Does patient have a medical provider? No   Patient referred to Not Applicable   Was a mental health screening completed? (GAINS tool) No   Does patient have dental issues? No   Does patient have vision issues? No   Does your patient have an abnormal blood pressure today? No   Since previous encounter, have you referred patient for abnormal blood pressure that resulted in a new diagnosis or medication change? No   Does your patient have an abnormal blood glucose today? No   Since previous encounter, have you referred patient for abnormal blood glucose that resulted in a new diagnosis or medication change? No   Was there a life-saving intervention made? No     Attended chair yoga class.

## 2016-12-11 NOTE — Congregational Nurse Program (Signed)
Congregational Nurse Program Note  Date of Encounter: 12/11/2016  Past Medical History: Past Medical History:  Diagnosis Date  . Anemia    hx of approx 40 years ago after prior surgery  . Anxiety   . Bronchitis    approx 2 years ago  . Depression   . Heart palpitations 2012   Palpitations after d/c from prior surgery. Pt thinks it was related to anxiety . denies any at this time  . History of total knee replacement    left total knee replacement  . Pneumonia    hx of    Encounter Details:     CNP Questionnaire - 12/11/16 1539      Patient Demographics   Is this a new or existing patient? New   Patient is considered a/an Not Applicable   Race Caucasian/White     Patient Assistance   Location of Patient Assistance Not Applicable   Patient's financial/insurance status Medicare   Uninsured Patient ([employer] Connects) No   Patient referred to apply for the following financial assistance Not Applicable   Food insecurities addressed Not Applicable   Transportation assistance No   Assistance securing medications No   Educational health offerings Exercise/physical activity     Encounter Details   Primary purpose of visit Education/Health Concerns   Was an Emergency Department visit averted? Not Applicable   Does patient have a medical provider? No   Patient referred to Not Applicable   Was a mental health screening completed? (GAINS tool) No   Does patient have dental issues? No   Does patient have vision issues? No   Does your patient have an abnormal blood pressure today? No   Since previous encounter, have you referred patient for abnormal blood pressure that resulted in a new diagnosis or medication change? No   Does your patient have an abnormal blood glucose today? No   Since previous encounter, have you referred patient for abnormal blood glucose that resulted in a new diagnosis or medication change? No   Was there a life-saving intervention made? No      Attended chair yoga class.

## 2017-02-12 NOTE — Congregational Nurse Program (Signed)
Congregational Nurse Program Note  Date of Encounter: 02/12/2017  Past Medical History: Past Medical History:  Diagnosis Date  . Anemia    hx of approx 40 years ago after prior surgery  . Anxiety   . Bronchitis    approx 2 years ago  . Depression   . Heart palpitations 2012   Palpitations after d/c from prior surgery. Pt thinks it was related to anxiety . denies any at this time  . History of total knee replacement    left total knee replacement  . Pneumonia    hx of    Encounter Details: CNP Questionnaire - 02/12/17 1456      Questionnaire   Food  No food insecurities      Anda KraftSharon Hamna Asa, CNP, [phone].

## 2017-04-22 DIAGNOSIS — E559 Vitamin D deficiency, unspecified: Secondary | ICD-10-CM | POA: Diagnosis not present

## 2017-04-22 DIAGNOSIS — Z0000 Encounter for general adult medical examination without abnormal findings: Secondary | ICD-10-CM | POA: Diagnosis not present

## 2017-04-22 DIAGNOSIS — G4700 Insomnia, unspecified: Secondary | ICD-10-CM | POA: Diagnosis not present

## 2017-04-22 DIAGNOSIS — M545 Low back pain: Secondary | ICD-10-CM | POA: Diagnosis not present

## 2017-04-22 DIAGNOSIS — R69 Illness, unspecified: Secondary | ICD-10-CM | POA: Diagnosis not present

## 2017-04-22 DIAGNOSIS — E7800 Pure hypercholesterolemia, unspecified: Secondary | ICD-10-CM | POA: Diagnosis not present

## 2017-04-24 DIAGNOSIS — M5416 Radiculopathy, lumbar region: Secondary | ICD-10-CM | POA: Diagnosis not present

## 2017-04-24 DIAGNOSIS — M9905 Segmental and somatic dysfunction of pelvic region: Secondary | ICD-10-CM | POA: Diagnosis not present

## 2017-04-24 DIAGNOSIS — M9903 Segmental and somatic dysfunction of lumbar region: Secondary | ICD-10-CM | POA: Diagnosis not present

## 2017-04-24 DIAGNOSIS — M955 Acquired deformity of pelvis: Secondary | ICD-10-CM | POA: Diagnosis not present

## 2017-05-09 NOTE — Congregational Nurse Program (Signed)
Congregational Nurse Program Note  Date of Encounter: 05/09/2017  Past Medical History: Past Medical History:  Diagnosis Date  . Anemia    hx of approx 40 years ago after prior surgery  . Anxiety   . Bronchitis    approx 2 years ago  . Depression   . Heart palpitations 2012   Palpitations after d/c from prior surgery. Pt thinks it was related to anxiety . denies any at this time  . History of total knee replacement    left total knee replacement  . Pneumonia    hx of    Encounter Details: CNP Questionnaire - 05/09/17 1705      Questionnaire   Patient Status  Not Applicable    Race  White or Caucasian    Location Patient Served At  Not Applicable    Insurance  Medicare    Uninsured  Not Applicable    Food  No food insecurities    Housing/Utilities  Yes, have permanent housing    Transportation  No transportation needs    Interpersonal Safety  Yes, feel physically and emotionally safe where you currently live    Medication  No medication insecurities    Medical Provider  Yes    Referrals  Not Applicable    ED Visit Averted  Not Applicable    Life-Saving Intervention Made  Not Applicable

## 2017-06-04 DIAGNOSIS — M9903 Segmental and somatic dysfunction of lumbar region: Secondary | ICD-10-CM | POA: Diagnosis not present

## 2017-06-04 DIAGNOSIS — M9905 Segmental and somatic dysfunction of pelvic region: Secondary | ICD-10-CM | POA: Diagnosis not present

## 2017-06-04 DIAGNOSIS — M955 Acquired deformity of pelvis: Secondary | ICD-10-CM | POA: Diagnosis not present

## 2017-06-04 DIAGNOSIS — M5416 Radiculopathy, lumbar region: Secondary | ICD-10-CM | POA: Diagnosis not present

## 2017-06-11 DIAGNOSIS — M9903 Segmental and somatic dysfunction of lumbar region: Secondary | ICD-10-CM | POA: Diagnosis not present

## 2017-06-11 DIAGNOSIS — M955 Acquired deformity of pelvis: Secondary | ICD-10-CM | POA: Diagnosis not present

## 2017-06-11 DIAGNOSIS — M9905 Segmental and somatic dysfunction of pelvic region: Secondary | ICD-10-CM | POA: Diagnosis not present

## 2017-06-11 DIAGNOSIS — M5416 Radiculopathy, lumbar region: Secondary | ICD-10-CM | POA: Diagnosis not present

## 2017-06-25 DIAGNOSIS — M955 Acquired deformity of pelvis: Secondary | ICD-10-CM | POA: Diagnosis not present

## 2017-06-25 DIAGNOSIS — M9905 Segmental and somatic dysfunction of pelvic region: Secondary | ICD-10-CM | POA: Diagnosis not present

## 2017-06-25 DIAGNOSIS — M9903 Segmental and somatic dysfunction of lumbar region: Secondary | ICD-10-CM | POA: Diagnosis not present

## 2017-06-25 DIAGNOSIS — M5416 Radiculopathy, lumbar region: Secondary | ICD-10-CM | POA: Diagnosis not present

## 2017-07-16 DIAGNOSIS — M5416 Radiculopathy, lumbar region: Secondary | ICD-10-CM | POA: Diagnosis not present

## 2017-07-16 DIAGNOSIS — M9905 Segmental and somatic dysfunction of pelvic region: Secondary | ICD-10-CM | POA: Diagnosis not present

## 2017-07-16 DIAGNOSIS — M955 Acquired deformity of pelvis: Secondary | ICD-10-CM | POA: Diagnosis not present

## 2017-07-16 DIAGNOSIS — M9903 Segmental and somatic dysfunction of lumbar region: Secondary | ICD-10-CM | POA: Diagnosis not present

## 2017-07-31 DIAGNOSIS — M9905 Segmental and somatic dysfunction of pelvic region: Secondary | ICD-10-CM | POA: Diagnosis not present

## 2017-07-31 DIAGNOSIS — M955 Acquired deformity of pelvis: Secondary | ICD-10-CM | POA: Diagnosis not present

## 2017-07-31 DIAGNOSIS — M9903 Segmental and somatic dysfunction of lumbar region: Secondary | ICD-10-CM | POA: Diagnosis not present

## 2017-07-31 DIAGNOSIS — M5416 Radiculopathy, lumbar region: Secondary | ICD-10-CM | POA: Diagnosis not present

## 2017-08-21 DIAGNOSIS — M955 Acquired deformity of pelvis: Secondary | ICD-10-CM | POA: Diagnosis not present

## 2017-08-21 DIAGNOSIS — M9903 Segmental and somatic dysfunction of lumbar region: Secondary | ICD-10-CM | POA: Diagnosis not present

## 2017-08-21 DIAGNOSIS — M9905 Segmental and somatic dysfunction of pelvic region: Secondary | ICD-10-CM | POA: Diagnosis not present

## 2017-08-21 DIAGNOSIS — M5416 Radiculopathy, lumbar region: Secondary | ICD-10-CM | POA: Diagnosis not present

## 2017-08-30 DIAGNOSIS — M19012 Primary osteoarthritis, left shoulder: Secondary | ICD-10-CM | POA: Diagnosis not present

## 2017-09-04 DIAGNOSIS — M9905 Segmental and somatic dysfunction of pelvic region: Secondary | ICD-10-CM | POA: Diagnosis not present

## 2017-09-04 DIAGNOSIS — M955 Acquired deformity of pelvis: Secondary | ICD-10-CM | POA: Diagnosis not present

## 2017-09-04 DIAGNOSIS — M5416 Radiculopathy, lumbar region: Secondary | ICD-10-CM | POA: Diagnosis not present

## 2017-09-04 DIAGNOSIS — M9903 Segmental and somatic dysfunction of lumbar region: Secondary | ICD-10-CM | POA: Diagnosis not present

## 2017-09-13 DIAGNOSIS — M9903 Segmental and somatic dysfunction of lumbar region: Secondary | ICD-10-CM | POA: Diagnosis not present

## 2017-09-13 DIAGNOSIS — M5416 Radiculopathy, lumbar region: Secondary | ICD-10-CM | POA: Diagnosis not present

## 2017-09-13 DIAGNOSIS — M9905 Segmental and somatic dysfunction of pelvic region: Secondary | ICD-10-CM | POA: Diagnosis not present

## 2017-09-13 DIAGNOSIS — M955 Acquired deformity of pelvis: Secondary | ICD-10-CM | POA: Diagnosis not present

## 2017-10-03 ENCOUNTER — Other Ambulatory Visit: Payer: Self-pay | Admitting: Orthopedic Surgery

## 2017-10-14 ENCOUNTER — Other Ambulatory Visit: Payer: Self-pay | Admitting: Orthopedic Surgery

## 2017-10-14 DIAGNOSIS — M19012 Primary osteoarthritis, left shoulder: Secondary | ICD-10-CM

## 2017-10-21 DIAGNOSIS — M9905 Segmental and somatic dysfunction of pelvic region: Secondary | ICD-10-CM | POA: Diagnosis not present

## 2017-10-21 DIAGNOSIS — M9903 Segmental and somatic dysfunction of lumbar region: Secondary | ICD-10-CM | POA: Diagnosis not present

## 2017-10-21 DIAGNOSIS — M955 Acquired deformity of pelvis: Secondary | ICD-10-CM | POA: Diagnosis not present

## 2017-10-21 DIAGNOSIS — M5416 Radiculopathy, lumbar region: Secondary | ICD-10-CM | POA: Diagnosis not present

## 2017-10-28 ENCOUNTER — Ambulatory Visit
Admission: RE | Admit: 2017-10-28 | Discharge: 2017-10-28 | Disposition: A | Discharge status: Home / Self Care | Payer: Medicare Other | Source: Ambulatory Visit | Attending: Orthopedic Surgery | Admitting: Orthopedic Surgery

## 2017-10-28 DIAGNOSIS — M19012 Primary osteoarthritis, left shoulder: Secondary | ICD-10-CM | POA: Diagnosis not present

## 2017-10-28 DIAGNOSIS — M25512 Pain in left shoulder: Secondary | ICD-10-CM | POA: Diagnosis not present

## 2017-11-11 NOTE — Pre-Procedure Instructions (Signed)
Janice Allen  11/11/2017      [facility] - [place] ([place]), [place] - [address] [place] [address] Phone: [phone] Fax: [phone]    Your procedure is scheduled on Thursday, August 15.  [facility] at 8:00 A.M.  Call this number if you have problems the morning of surgery:  [phone]   Remember:  Do not eat or drink after midnight.    Take these medicines the morning of surgery with A SIP OF WATER: NONE   7 days prior to surgery STOP taking any Aspirin(unless otherwise instructed by your surgeon), Aleve, Naproxen, Ibuprofen, Motrin, Advil, Goody's, BC's, all herbal medications, fish oil, and all vitamins   Do not wear jewelry.  Do not wear lotions, powders, or colognes, or deodorant.  Men may shave face and neck.  Do not bring valuables to the hospital.  [facility] is not responsible for any belongings or valuables.  Hearing aids, eyeglasses, contacts, dentures or bridgework may not be worn into surgery.  Leave your suitcase in the car.  After surgery it may be brought to your room.  For patients admitted to the hospital, discharge time will be determined by your treatment team.  Patients discharged the day of surgery will not be allowed to drive home.   Name and phone number of your driver:   Special instructions:   [place]- Preparing For Surgery  Before surgery, you can play an important role. Because skin is not sterile, your skin needs to be as free of germs as possible. You can reduce the number of germs on your skin by washing with CHG (chlorahexidine gluconate) Soap before surgery.  CHG is an antiseptic cleaner which kills germs and bonds with the skin to continue killing germs even after washing.    Oral Hygiene is also important to reduce your risk of infection.  Remember - BRUSH YOUR TEETH THE MORNING OF SURGERY WITH YOUR REGULAR TOOTHPASTE  Please do not use if you  have an allergy to CHG or antibacterial soaps. If your skin becomes reddened/irritated stop using the CHG.  Do not shave (including legs and underarms) for at least 48 hours prior to first CHG shower. It is OK to shave your face.  Please follow these instructions carefully.   1. Shower the NIGHT BEFORE SURGERY and the MORNING OF SURGERY with CHG.   2. If you chose to wash your hair, wash your hair first as usual with your normal shampoo.  3. After you shampoo, rinse your hair and body thoroughly to remove the shampoo.  4. Use CHG as you would any other liquid soap. You can apply CHG directly to the skin and wash gently with a scrungie or a clean washcloth.   5. Apply the CHG Soap to your body ONLY FROM THE NECK DOWN.  Do not use on open wounds or open sores. Avoid contact with your eyes, ears, mouth and genitals (private parts). Wash Face and genitals (private parts)  with your normal soap.  6. Wash thoroughly, paying special attention to the area where your surgery will be performed.  7. Thoroughly rinse your body with warm water from the neck down.  8. DO NOT shower/wash with your normal soap after using and rinsing off the CHG Soap.  9. Pat yourself dry with a CLEAN TOWEL.  10. Wear CLEAN PAJAMAS to bed the night before surgery, wear comfortable clothes the morning of  surgery  11. Place CLEAN SHEETS on your bed the night of your first shower and DO NOT SLEEP WITH PETS.    Day of Surgery:  Do not apply any deodorants/lotions.  Please wear clean clothes to the hospital/surgery center.   Remember to brush your teeth WITH YOUR REGULAR TOOTHPASTE.    Please read over the following fact sheets that you were given.

## 2017-11-12 ENCOUNTER — Encounter (HOSPITAL_COMMUNITY)
Admission: RE | Admit: 2017-11-12 | Discharge: 2017-11-12 | Disposition: A | Discharge status: Home / Self Care | Payer: Medicare HMO | Source: Ambulatory Visit | Attending: Orthopedic Surgery | Admitting: Orthopedic Surgery

## 2017-11-12 ENCOUNTER — Ambulatory Visit (HOSPITAL_COMMUNITY)
Admission: RE | Admit: 2017-11-12 | Discharge: 2017-11-12 | Disposition: A | Discharge status: Home / Self Care | Payer: Medicare HMO | Source: Ambulatory Visit | Attending: Orthopedic Surgery | Admitting: Orthopedic Surgery

## 2017-11-12 ENCOUNTER — Inpatient Hospital Stay (HOSPITAL_COMMUNITY): Admission: RE | Admit: 2017-11-12 | Payer: Medicare Other | Source: Ambulatory Visit

## 2017-11-12 ENCOUNTER — Encounter (HOSPITAL_COMMUNITY): Payer: Self-pay

## 2017-11-12 ENCOUNTER — Other Ambulatory Visit: Payer: Self-pay

## 2017-11-12 DIAGNOSIS — Z01818 Encounter for other preprocedural examination: Secondary | ICD-10-CM | POA: Diagnosis not present

## 2017-11-12 DIAGNOSIS — S2231XA Fracture of one rib, right side, initial encounter for closed fracture: Secondary | ICD-10-CM | POA: Insufficient documentation

## 2017-11-12 HISTORY — DX: Unspecified osteoarthritis, unspecified site: M19.90

## 2017-11-12 HISTORY — DX: Gastro-esophageal reflux disease without esophagitis: K21.9

## 2017-11-12 LAB — CBC WITH DIFFERENTIAL/PLATELET
ABS IMMATURE GRANULOCYTES: 0 10*3/uL (ref 0.0–0.1)
Basophils Absolute: 0.1 10*3/uL (ref 0.0–0.1)
Basophils Relative: 1 %
Eosinophils Absolute: 0.1 10*3/uL (ref 0.0–0.7)
Eosinophils Relative: 1 %
HEMATOCRIT: 40.9 % (ref 36.0–46.0)
HEMOGLOBIN: 13.2 g/dL (ref 12.0–15.0)
Immature Granulocytes: 0 %
LYMPHS ABS: 2.6 10*3/uL (ref 0.7–4.0)
LYMPHS PCT: 32 %
MCH: 29.1 pg (ref 26.0–34.0)
MCHC: 32.3 g/dL (ref 30.0–36.0)
MCV: 90.1 fL (ref 78.0–100.0)
Monocytes Absolute: 0.8 10*3/uL (ref 0.1–1.0)
Monocytes Relative: 10 %
NEUTROS ABS: 4.4 10*3/uL (ref 1.7–7.7)
Neutrophils Relative %: 56 %
Platelets: 174 10*3/uL (ref 150–400)
RBC: 4.54 MIL/uL (ref 3.87–5.11)
RDW: 13.5 % (ref 11.5–15.5)
WBC: 8 10*3/uL (ref 4.0–10.5)

## 2017-11-12 LAB — COMPREHENSIVE METABOLIC PANEL
ALBUMIN: 4.2 g/dL (ref 3.5–5.0)
ALK PHOS: 65 U/L (ref 38–126)
ALT: 14 U/L (ref 0–44)
AST: 23 U/L (ref 15–41)
Anion gap: 8 (ref 5–15)
BILIRUBIN TOTAL: 0.8 mg/dL (ref 0.3–1.2)
BUN: 16 mg/dL (ref 8–23)
CALCIUM: 9.3 mg/dL (ref 8.9–10.3)
CO2: 23 mmol/L (ref 22–32)
CREATININE: 0.75 mg/dL (ref 0.44–1.00)
Chloride: 106 mmol/L (ref 98–111)
GFR calc Af Amer: >60 mL/min (ref >60)
GFR calc non Af Amer: >60 mL/min (ref >60)
Glucose, Bld: 117 mg/dL — ABNORMAL HIGH (ref 70–99)
Potassium: 4.3 mmol/L (ref 3.5–5.1)
Sodium: 137 mmol/L (ref 135–145)
Total Protein: 6.7 g/dL (ref 6.5–8.1)

## 2017-11-12 LAB — URINALYSIS, ROUTINE W REFLEX MICROSCOPIC
Bilirubin Urine: NEGATIVE
Glucose, UA: NEGATIVE mg/dL
Hgb urine dipstick: NEGATIVE
Ketones, ur: NEGATIVE mg/dL
LEUKOCYTES UA: NEGATIVE
Nitrite: NEGATIVE
PH: 5 (ref 5.0–8.0)
Protein, ur: NEGATIVE mg/dL
SPECIFIC GRAVITY, URINE: 1.02 (ref 1.005–1.030)

## 2017-11-12 LAB — SURGICAL PCR SCREEN
MRSA, PCR: NEGATIVE
Staphylococcus aureus: NEGATIVE

## 2017-11-12 LAB — PROTIME-INR
INR: 1
Prothrombin Time: 13.1 seconds (ref 11.4–15.2)

## 2017-11-12 LAB — TYPE AND SCREEN
ABO/RH(D): O POS
Antibody Screen: NEGATIVE

## 2017-11-12 LAB — APTT: aPTT: 28 seconds (ref 24–36)

## 2017-11-12 NOTE — Progress Notes (Signed)
PCP: Dr. Clovis RileyMitchell Cardiologist: Denies  EKG: Today CXR: Today ECHO: Denies Stress Test: Denies Cardiac Cath: Denies  Patient denies shortness of breath, fever, cough, and chest pain at PAT appointment.  Patient verbalized understanding of instructions provided today at the PAT appointment.  Patient asked to review instructions at home and day of surgery.

## 2017-11-18 DIAGNOSIS — M5416 Radiculopathy, lumbar region: Secondary | ICD-10-CM | POA: Diagnosis not present

## 2017-11-18 DIAGNOSIS — M9903 Segmental and somatic dysfunction of lumbar region: Secondary | ICD-10-CM | POA: Diagnosis not present

## 2017-11-18 DIAGNOSIS — M9905 Segmental and somatic dysfunction of pelvic region: Secondary | ICD-10-CM | POA: Diagnosis not present

## 2017-11-18 DIAGNOSIS — M955 Acquired deformity of pelvis: Secondary | ICD-10-CM | POA: Diagnosis not present

## 2017-11-20 MED ORDER — TRANEXAMIC ACID 1000 MG/10ML IV SOLN
1000.0000 mg | INTRAVENOUS | Status: AC
  Administered 2017-11-21: 1000 mg via INTRAVENOUS
  Filled 2017-11-20: qty 1100

## 2017-11-21 ENCOUNTER — Inpatient Hospital Stay (HOSPITAL_COMMUNITY)
Admission: RE | Admit: 2017-11-21 | Discharge: 2017-11-22 | DRG: 483 | Disposition: A | Discharge status: Home / Self Care | Payer: Medicare HMO | Source: Ambulatory Visit | Attending: Orthopedic Surgery | Admitting: Orthopedic Surgery

## 2017-11-21 ENCOUNTER — Encounter (HOSPITAL_COMMUNITY): Payer: Self-pay | Admitting: *Deleted

## 2017-11-21 ENCOUNTER — Inpatient Hospital Stay (HOSPITAL_COMMUNITY): Payer: Medicare HMO | Admitting: Emergency Medicine

## 2017-11-21 ENCOUNTER — Inpatient Hospital Stay (HOSPITAL_COMMUNITY): Payer: Medicare HMO | Admitting: Anesthesiology

## 2017-11-21 ENCOUNTER — Encounter (HOSPITAL_COMMUNITY)
Admission: RE | Disposition: A | Discharge status: Home / Self Care | Payer: Self-pay | Source: Ambulatory Visit | Attending: Orthopedic Surgery

## 2017-11-21 ENCOUNTER — Other Ambulatory Visit: Payer: Self-pay

## 2017-11-21 ENCOUNTER — Inpatient Hospital Stay (HOSPITAL_COMMUNITY): Payer: Medicare HMO

## 2017-11-21 DIAGNOSIS — G8918 Other acute postprocedural pain: Secondary | ICD-10-CM | POA: Diagnosis not present

## 2017-11-21 DIAGNOSIS — F419 Anxiety disorder, unspecified: Secondary | ICD-10-CM | POA: Diagnosis present

## 2017-11-21 DIAGNOSIS — F329 Major depressive disorder, single episode, unspecified: Secondary | ICD-10-CM | POA: Diagnosis present

## 2017-11-21 DIAGNOSIS — Z9851 Tubal ligation status: Secondary | ICD-10-CM

## 2017-11-21 DIAGNOSIS — M19012 Primary osteoarthritis, left shoulder: Principal | ICD-10-CM | POA: Diagnosis present

## 2017-11-21 DIAGNOSIS — Z96652 Presence of left artificial knee joint: Secondary | ICD-10-CM | POA: Diagnosis present

## 2017-11-21 DIAGNOSIS — Z96612 Presence of left artificial shoulder joint: Secondary | ICD-10-CM | POA: Diagnosis not present

## 2017-11-21 DIAGNOSIS — K219 Gastro-esophageal reflux disease without esophagitis: Secondary | ICD-10-CM | POA: Diagnosis present

## 2017-11-21 DIAGNOSIS — R69 Illness, unspecified: Secondary | ICD-10-CM | POA: Diagnosis not present

## 2017-11-21 DIAGNOSIS — Z471 Aftercare following joint replacement surgery: Secondary | ICD-10-CM | POA: Diagnosis not present

## 2017-11-21 HISTORY — PX: TOTAL SHOULDER ARTHROPLASTY: SHX126

## 2017-11-21 SURGERY — ARTHROPLASTY, SHOULDER, TOTAL
Anesthesia: General | Site: Shoulder | Laterality: Left

## 2017-11-21 MED ORDER — DEXAMETHASONE SODIUM PHOSPHATE 10 MG/ML IJ SOLN
INTRAMUSCULAR | Status: AC
  Filled 2017-11-21: qty 1

## 2017-11-21 MED ORDER — FENTANYL CITRATE (PF) 100 MCG/2ML IJ SOLN
INTRAMUSCULAR | Status: DC | PRN
  Administered 2017-11-21: 50 ug via INTRAVENOUS

## 2017-11-21 MED ORDER — BUPIVACAINE HCL (PF) 0.5 % IJ SOLN
INTRAMUSCULAR | Status: DC | PRN
  Administered 2017-11-21: 15 mL via PERINEURAL

## 2017-11-21 MED ORDER — MIDAZOLAM HCL 2 MG/2ML IJ SOLN
INTRAMUSCULAR | Status: AC
  Administered 2017-11-21: 2 mg via INTRAVENOUS
  Filled 2017-11-21: qty 2

## 2017-11-21 MED ORDER — DOCUSATE SODIUM 100 MG PO CAPS
100.0000 mg | ORAL_CAPSULE | Freq: Two times a day (BID) | ORAL | Status: DC
  Administered 2017-11-21 – 2017-11-22 (×2): 100 mg via ORAL
  Filled 2017-11-21 (×2): qty 1

## 2017-11-21 MED ORDER — METHOCARBAMOL 1000 MG/10ML IJ SOLN: 500.0000 mg | Freq: Four times a day (QID) | INTRAVENOUS | Status: DC | PRN

## 2017-11-21 MED ORDER — ACETAMINOPHEN 500 MG PO TABS
1000.0000 mg | ORAL_TABLET | Freq: Four times a day (QID) | ORAL | Status: AC
  Administered 2017-11-21 – 2017-11-22 (×3): 1000 mg via ORAL
  Filled 2017-11-21 (×3): qty 2

## 2017-11-21 MED ORDER — DIPHENHYDRAMINE HCL 12.5 MG/5ML PO ELIX: 12.5000 mg | ORAL_SOLUTION | ORAL | Status: DC | PRN

## 2017-11-21 MED ORDER — CEFAZOLIN SODIUM-DEXTROSE 1-4 GM/50ML-% IV SOLN
1.0000 g | Freq: Four times a day (QID) | INTRAVENOUS | Status: AC
  Administered 2017-11-21 – 2017-11-22 (×3): 1 g via INTRAVENOUS
  Filled 2017-11-21 (×3): qty 50

## 2017-11-21 MED ORDER — METHOCARBAMOL 500 MG PO TABS
500.0000 mg | ORAL_TABLET | Freq: Four times a day (QID) | ORAL | Status: DC | PRN
  Filled 2017-11-21: qty 1

## 2017-11-21 MED ORDER — BUPIVACAINE LIPOSOME 1.3 % IJ SUSP
INTRAMUSCULAR | Status: DC | PRN
  Administered 2017-11-21: 10 mL via PERINEURAL

## 2017-11-21 MED ORDER — POLYETHYLENE GLYCOL 3350 17 G PO PACK: 17.0000 g | PACK | Freq: Every day | ORAL | Status: DC | PRN

## 2017-11-21 MED ORDER — HYDROMORPHONE HCL 1 MG/ML IJ SOLN: 0.5000 mg | INTRAMUSCULAR | Status: DC | PRN

## 2017-11-21 MED ORDER — SODIUM CHLORIDE 0.9 % IR SOLN
Status: DC | PRN
  Administered 2017-11-21: 3000 mL

## 2017-11-21 MED ORDER — POVIDONE-IODINE 7.5 % EX SOLN: Freq: Once | CUTANEOUS | Status: DC

## 2017-11-21 MED ORDER — ROCURONIUM BROMIDE 50 MG/5ML IV SOSY
PREFILLED_SYRINGE | INTRAVENOUS | Status: DC | PRN
  Administered 2017-11-21: 40 mg via INTRAVENOUS

## 2017-11-21 MED ORDER — ONDANSETRON HCL 4 MG PO TABS: 4.0000 mg | ORAL_TABLET | Freq: Four times a day (QID) | ORAL | Status: DC | PRN

## 2017-11-21 MED ORDER — FENTANYL CITRATE (PF) 100 MCG/2ML IJ SOLN
INTRAMUSCULAR | Status: AC
  Administered 2017-11-21: 50 ug via INTRAVENOUS
  Filled 2017-11-21: qty 2

## 2017-11-21 MED ORDER — ONDANSETRON HCL 4 MG/2ML IJ SOLN: 4.0000 mg | Freq: Four times a day (QID) | INTRAMUSCULAR | Status: DC | PRN

## 2017-11-21 MED ORDER — METOCLOPRAMIDE HCL 5 MG PO TABS: 5.0000 mg | ORAL_TABLET | Freq: Three times a day (TID) | ORAL | Status: DC | PRN

## 2017-11-21 MED ORDER — BISACODYL 5 MG PO TBEC: 5.0000 mg | DELAYED_RELEASE_TABLET | Freq: Every day | ORAL | Status: DC | PRN

## 2017-11-21 MED ORDER — SUGAMMADEX SODIUM 200 MG/2ML IV SOLN
INTRAVENOUS | Status: DC | PRN
  Administered 2017-11-21: 200 mg via INTRAVENOUS

## 2017-11-21 MED ORDER — FENTANYL CITRATE (PF) 250 MCG/5ML IJ SOLN
INTRAMUSCULAR | Status: AC
  Filled 2017-11-21: qty 5

## 2017-11-21 MED ORDER — ALUM & MAG HYDROXIDE-SIMETH 200-200-20 MG/5ML PO SUSP: 30.0000 mL | ORAL | Status: DC | PRN

## 2017-11-21 MED ORDER — 0.9 % SODIUM CHLORIDE (POUR BTL) OPTIME
TOPICAL | Status: DC | PRN
  Administered 2017-11-21: 1000 mL

## 2017-11-21 MED ORDER — DEXAMETHASONE SODIUM PHOSPHATE 10 MG/ML IJ SOLN
INTRAMUSCULAR | Status: DC | PRN
  Administered 2017-11-21: 10 mg via INTRAVENOUS

## 2017-11-21 MED ORDER — SODIUM CHLORIDE 0.9 % IV SOLN
INTRAVENOUS | Status: DC
  Administered 2017-11-21: 16:00:00 via INTRAVENOUS

## 2017-11-21 MED ORDER — FENTANYL CITRATE (PF) 100 MCG/2ML IJ SOLN
50.0000 ug | Freq: Once | INTRAMUSCULAR | Status: AC
  Administered 2017-11-21: 50 ug via INTRAVENOUS

## 2017-11-21 MED ORDER — CEFAZOLIN SODIUM-DEXTROSE 2-4 GM/100ML-% IV SOLN
INTRAVENOUS | Status: AC
  Filled 2017-11-21: qty 100

## 2017-11-21 MED ORDER — LACTATED RINGERS IV SOLN
INTRAVENOUS | Status: DC
  Administered 2017-11-21: 08:00:00 via INTRAVENOUS

## 2017-11-21 MED ORDER — ONDANSETRON HCL 4 MG/2ML IJ SOLN
INTRAMUSCULAR | Status: AC
  Filled 2017-11-21: qty 2

## 2017-11-21 MED ORDER — PHENYLEPHRINE HCL 10 MG/ML IJ SOLN
INTRAMUSCULAR | Status: DC | PRN
  Administered 2017-11-21: 20 ug/min via INTRAVENOUS

## 2017-11-21 MED ORDER — ONDANSETRON HCL 4 MG/2ML IJ SOLN
INTRAMUSCULAR | Status: DC | PRN
  Administered 2017-11-21: 4 mg via INTRAVENOUS

## 2017-11-21 MED ORDER — ROCURONIUM BROMIDE 50 MG/5ML IV SOSY
PREFILLED_SYRINGE | INTRAVENOUS | Status: AC
  Filled 2017-11-21: qty 5

## 2017-11-21 MED ORDER — ALPRAZOLAM 0.25 MG PO TABS: 0.1250 mg | ORAL_TABLET | Freq: Every evening | ORAL | Status: DC | PRN

## 2017-11-21 MED ORDER — PROPOFOL 10 MG/ML IV BOLUS
INTRAVENOUS | Status: AC
  Filled 2017-11-21: qty 20

## 2017-11-21 MED ORDER — LIDOCAINE 2% (20 MG/ML) 5 ML SYRINGE
INTRAMUSCULAR | Status: AC
  Filled 2017-11-21: qty 5

## 2017-11-21 MED ORDER — PROPOFOL 10 MG/ML IV BOLUS
INTRAVENOUS | Status: DC | PRN
  Administered 2017-11-21: 130 mg via INTRAVENOUS

## 2017-11-21 MED ORDER — OXYCODONE HCL 5 MG PO TABS: 5.0000 mg | ORAL_TABLET | ORAL | Status: DC | PRN

## 2017-11-21 MED ORDER — FLEET ENEMA 7-19 GM/118ML RE ENEM: 1.0000 | ENEMA | Freq: Once | RECTAL | Status: DC | PRN

## 2017-11-21 MED ORDER — PHENOL 1.4 % MT LIQD: 1.0000 | OROMUCOSAL | Status: DC | PRN

## 2017-11-21 MED ORDER — ZOLPIDEM TARTRATE 5 MG PO TABS: 5.0000 mg | ORAL_TABLET | Freq: Every evening | ORAL | Status: DC | PRN

## 2017-11-21 MED ORDER — METOCLOPRAMIDE HCL 5 MG/ML IJ SOLN: 5.0000 mg | Freq: Three times a day (TID) | INTRAMUSCULAR | Status: DC | PRN

## 2017-11-21 MED ORDER — CEFAZOLIN SODIUM-DEXTROSE 2-4 GM/100ML-% IV SOLN
2.0000 g | INTRAVENOUS | Status: AC
  Administered 2017-11-21: 2 g via INTRAVENOUS

## 2017-11-21 MED ORDER — ASPIRIN EC 81 MG PO TBEC
81.0000 mg | DELAYED_RELEASE_TABLET | Freq: Two times a day (BID) | ORAL | Status: DC
  Administered 2017-11-21 – 2017-11-22 (×2): 81 mg via ORAL
  Filled 2017-11-21 (×2): qty 1

## 2017-11-21 MED ORDER — MENTHOL 3 MG MT LOZG: 1.0000 | LOZENGE | OROMUCOSAL | Status: DC | PRN

## 2017-11-21 MED ORDER — ACETAMINOPHEN 325 MG PO TABS
325.0000 mg | ORAL_TABLET | Freq: Four times a day (QID) | ORAL | Status: DC | PRN
  Filled 2017-11-21: qty 2

## 2017-11-21 MED ORDER — OXYCODONE HCL 5 MG PO TABS: 10.0000 mg | ORAL_TABLET | ORAL | Status: DC | PRN

## 2017-11-21 MED ORDER — MIDAZOLAM HCL 2 MG/2ML IJ SOLN
2.0000 mg | Freq: Once | INTRAMUSCULAR | Status: AC
  Administered 2017-11-21: 2 mg via INTRAVENOUS

## 2017-11-21 SURGICAL SUPPLY — 72 items
AID PSTN UNV HD RSTRNT DISP (MISCELLANEOUS) ×1
BIT DRILL 5/64X5 DISP (BIT) ×2 IMPLANT
BLADE SAW SAG 73X25 THK (BLADE) ×1
BLADE SAW SGTL 73X25 THK (BLADE) ×1 IMPLANT
BLADE SURG 15 STRL LF DISP TIS (BLADE) ×1 IMPLANT
BLADE SURG 15 STRL SS (BLADE) ×2
CEMENT BONE DEPUY (Cement) ×1 IMPLANT
CHLORAPREP W/TINT 26ML (MISCELLANEOUS) ×2 IMPLANT
COVER SURGICAL LIGHT HANDLE (MISCELLANEOUS) ×2 IMPLANT
DRAPE INCISE IOBAN 66X45 STRL (DRAPES) ×2 IMPLANT
DRAPE ORTHO SPLIT 77X108 STRL (DRAPES) ×4
DRAPE SURG 17X23 STRL (DRAPES) ×2 IMPLANT
DRAPE SURG ORHT 6 SPLT 77X108 (DRAPES) ×2 IMPLANT
DRAPE U-SHAPE 47X51 STRL (DRAPES) ×2 IMPLANT
DRSG AQUACEL AG ADV 3.5X 6 (GAUZE/BANDAGES/DRESSINGS) ×1 IMPLANT
DRSG AQUACEL AG ADV 3.5X10 (GAUZE/BANDAGES/DRESSINGS) IMPLANT
ELECT BLADE 4.0 EZ CLEAN MEGAD (MISCELLANEOUS)
ELECT REM PT RETURN 9FT ADLT (ELECTROSURGICAL) ×2
ELECTRODE BLDE 4.0 EZ CLN MEGD (MISCELLANEOUS) IMPLANT
ELECTRODE REM PT RTRN 9FT ADLT (ELECTROSURGICAL) ×1 IMPLANT
GLENOID PEG SHOULDER 40MM SML (Shoulder) IMPLANT
GLOVE BIO SURGEON STRL SZ7 (GLOVE) ×2 IMPLANT
GLOVE BIO SURGEON STRL SZ7.5 (GLOVE) ×2 IMPLANT
GLOVE BIOGEL PI IND STRL 7.0 (GLOVE) ×1 IMPLANT
GLOVE BIOGEL PI IND STRL 8 (GLOVE) ×1 IMPLANT
GLOVE BIOGEL PI INDICATOR 7.0 (GLOVE) ×1
GLOVE BIOGEL PI INDICATOR 8 (GLOVE) ×1
GOWN STRL REUS W/ TWL LRG LVL3 (GOWN DISPOSABLE) ×1 IMPLANT
GOWN STRL REUS W/ TWL XL LVL3 (GOWN DISPOSABLE) ×1 IMPLANT
GOWN STRL REUS W/TWL LRG LVL3 (GOWN DISPOSABLE) ×2
GOWN STRL REUS W/TWL XL LVL3 (GOWN DISPOSABLE) ×2
GUIDEWIRE GLENOID 2.5X220 (WIRE) ×1 IMPLANT
HANDPIECE INTERPULSE COAX TIP (DISPOSABLE) ×2
HEAD HUMERAL AEQUALIS 46X17 (Head) ×2 IMPLANT
HEAD HUMERAL LOW OS 46X17 (Head) IMPLANT
HEMOSTAT SURGICEL 2X14 (HEMOSTASIS) ×3 IMPLANT
HOOD PEEL AWAY FLYTE STAYCOOL (MISCELLANEOUS) ×4 IMPLANT
KIT BASIN OR (CUSTOM PROCEDURE TRAY) ×2 IMPLANT
KIT TURNOVER KIT B (KITS) ×2 IMPLANT
MANIFOLD NEPTUNE II (INSTRUMENTS) ×2 IMPLANT
NDL MAYO TROCAR (NEEDLE) ×1 IMPLANT
NEEDLE MAYO TROCAR (NEEDLE) ×2 IMPLANT
NS IRRIG 1000ML POUR BTL (IV SOLUTION) ×2 IMPLANT
PACK SHOULDER (CUSTOM PROCEDURE TRAY) ×2 IMPLANT
PAD ARMBOARD 7.5X6 YLW CONV (MISCELLANEOUS) ×4 IMPLANT
RESTRAINT HEAD UNIVERSAL NS (MISCELLANEOUS) ×2 IMPLANT
RETRIEVER SUT HEWSON (MISCELLANEOUS) ×2 IMPLANT
SET HNDPC FAN SPRY TIP SCT (DISPOSABLE) ×1 IMPLANT
SHOULDER GLENOID PEG 40MM SML (Shoulder) ×2 IMPLANT
SLING ARM FOAM STRAP LRG (SOFTGOODS) ×2 IMPLANT
SLING ARM FOAM STRAP MED (SOFTGOODS) IMPLANT
SMARTMIX MINI TOWER (MISCELLANEOUS) ×2
SPONGE LAP 18X18 X RAY DECT (DISPOSABLE) ×2 IMPLANT
SPONGE LAP 4X18 RFD (DISPOSABLE) IMPLANT
STEM HUMERAL SZ2B STND 70 PTC (Stem) ×2 IMPLANT
STEM HUMERAL SZ2BSTD 70 PTC (Stem) IMPLANT
STRIP CLOSURE SKIN 1/2X4 (GAUZE/BANDAGES/DRESSINGS) ×2 IMPLANT
SUCTION FRAZIER HANDLE 10FR (MISCELLANEOUS) ×1
SUCTION TUBE FRAZIER 10FR DISP (MISCELLANEOUS) ×1 IMPLANT
SUPPORT WRAP ARM LG (MISCELLANEOUS) ×2 IMPLANT
SUT ETHIBOND NAB CT1 #1 30IN (SUTURE) ×6 IMPLANT
SUT FIBERWIRE #2 38 T-5 BLUE (SUTURE)
SUT MNCRL AB 4-0 PS2 18 (SUTURE) ×2 IMPLANT
SUT VIC AB 0 CT1 27 (SUTURE) ×2
SUT VIC AB 0 CT1 27XBRD ANBCTR (SUTURE) IMPLANT
SUT VIC AB 2-0 CT1 27 (SUTURE) ×2
SUT VIC AB 2-0 CT1 TAPERPNT 27 (SUTURE) ×1 IMPLANT
SUTURE FIBERWR #2 38 T-5 BLUE (SUTURE) IMPLANT
TAPE LABRALWHITE 1.5X36 (TAPE) ×2 IMPLANT
TAPE SUT LABRALTAP WHT/BLK (SUTURE) ×2 IMPLANT
TOWEL OR 17X26 10 PK STRL BLUE (TOWEL DISPOSABLE) ×2 IMPLANT
TOWER SMARTMIX MINI (MISCELLANEOUS) ×1 IMPLANT

## 2017-11-21 NOTE — Anesthesia Procedure Notes (Signed)
Procedure Name: Intubation Date/Time: 11/21/2017 9:34 AM Performed by: Pearson Grippeobertson, Kaius Daino M, CRNA Pre-anesthesia Checklist: Patient identified, Emergency Drugs available, Suction available and Patient being monitored Patient Re-evaluated:Patient Re-evaluated prior to induction Oxygen Delivery Method: Circle system utilized Preoxygenation: Pre-oxygenation with 100% oxygen Induction Type: IV induction Ventilation: Mask ventilation without difficulty Laryngoscope Size: Miller and 2 Grade View: Grade I Tube type: Oral Tube size: 7.0 mm Number of attempts: 1 Airway Equipment and Method: Stylet Placement Confirmation: ETT inserted through vocal cords under direct vision,  positive ETCO2 and breath sounds checked- equal and bilateral Secured at: 20.5 cm Tube secured with: Tape Dental Injury: Teeth and Oropharynx as per pre-operative assessment

## 2017-11-21 NOTE — Anesthesia Procedure Notes (Signed)
Anesthesia Regional Block: Interscalene brachial plexus block   Pre-Anesthetic Checklist: ,, timeout performed, Correct Patient, Correct Site, Correct Laterality, Correct Procedure, Correct Position, site marked, Risks and benefits discussed,  Surgical consent,  Pre-op evaluation,  At surgeon's request and post-op pain management  Laterality: Left  Prep: chloraprep       Needles:  Injection technique: Single-shot  Needle Type: Echogenic Stimulator Needle     Needle Length: 9cm  Needle Gauge: 21     Additional Needles:   Procedures:, nerve stimulator,,, ultrasound used (permanent image in chart),,,,   Nerve Stimulator or Paresthesia:  Response: deltoid, 0.5 mA,   Additional Responses:   Narrative:  Start time: 11/21/2017 8:46 AM End time: 11/21/2017 8:53 AM Injection made incrementally with aspirations every 5 mL.  Performed by: Personally  Anesthesiologist: Marcene DuosFitzgerald, Peri Kreft, MD

## 2017-11-21 NOTE — Anesthesia Postprocedure Evaluation (Signed)
Anesthesia Post Note  Patient: Janice Allen  Procedure(s) Performed: LEFT TOTAL SHOULDER ARTHROPLASTY (Left Shoulder)     Patient location during evaluation: PACU Anesthesia Type: General Level of consciousness: awake and alert Pain management: pain level controlled Vital Signs Assessment: post-procedure vital signs reviewed and stable Respiratory status: spontaneous breathing, nonlabored ventilation, respiratory function stable and patient connected to nasal cannula oxygen Cardiovascular status: blood pressure returned to baseline and stable Postop Assessment: no apparent nausea or vomiting Anesthetic complications: no    Last Vitals:  Vitals:   11/21/17 1200 11/21/17 1300  BP: 126/66 136/74  Pulse: 64 74  Resp: 15 20  Temp: (!) 36.4 C (!) 36.1 C  SpO2: 100%     Last Pain:  Vitals:   11/21/17 1300  TempSrc:   PainSc: 0-No pain                 Kennieth RadFitzgerald, Jada Kuhnert E

## 2017-11-21 NOTE — Transfer of Care (Signed)
Immediate Anesthesia Transfer of Care Note  Patient: Janice Allen  Procedure(s) Performed: LEFT TOTAL SHOULDER ARTHROPLASTY (Left Shoulder)  Patient Location: PACU  Anesthesia Type:GA combined with regional for post-op pain  Level of Consciousness: awake, alert  and oriented  Airway & Oxygen Therapy: Patient Spontanous Breathing and Patient connected to face mask oxygen  Post-op Assessment: Report given to RN and Post -op Vital signs reviewed and stable  Post vital signs: Reviewed and stable  Last Vitals:  Vitals Value Taken Time  BP    Temp    Pulse 71 11/21/2017 11:13 AM  Resp 15 11/21/2017 11:13 AM  SpO2 99 % 11/21/2017 11:13 AM  Vitals shown include unvalidated device data.  Last Pain:  Vitals:   11/21/17 0855  TempSrc:   PainSc: 0-No pain         Complications: No apparent anesthesia complications

## 2017-11-21 NOTE — Anesthesia Preprocedure Evaluation (Signed)
Anesthesia Evaluation  Patient identified by MRN, date of birth, ID band Patient awake    Reviewed: Allergy & Precautions, NPO status , Patient's Chart, lab work & pertinent test results  Airway Mallampati: II  TM Distance: >3 FB Neck ROM: Full    Dental  (+) Dental Advisory Given   Pulmonary neg pulmonary ROS,    breath sounds clear to auscultation       Cardiovascular negative cardio ROS   Rhythm:Regular Rate:Normal     Neuro/Psych Anxiety Depression negative neurological ROS     GI/Hepatic Neg liver ROS, GERD  ,  Endo/Other  negative endocrine ROS  Renal/GU negative Renal ROS     Musculoskeletal  (+) Arthritis ,   Abdominal   Peds  Hematology negative hematology ROS (+)   Anesthesia Other Findings   Reproductive/Obstetrics                             Lab Results  Component Value Date   WBC 8.0 11/12/2017   HGB 13.2 11/12/2017   HCT 40.9 11/12/2017   MCV 90.1 11/12/2017   PLT 174 11/12/2017   Lab Results  Component Value Date   CREATININE 0.75 11/12/2017   BUN 16 11/12/2017   NA 137 11/12/2017   K 4.3 11/12/2017   CL 106 11/12/2017   CO2 23 11/12/2017    Anesthesia Physical Anesthesia Plan  ASA: II  Anesthesia Plan: General   Post-op Pain Management: GA combined w/ Regional for post-op pain   Induction: Intravenous  PONV Risk Score and Plan: 3 and Dexamethasone, Ondansetron and Treatment may vary due to age or medical condition  Airway Management Planned: Oral ETT  Additional Equipment:   Intra-op Plan:   Post-operative Plan: Extubation in OR  Informed Consent: I have reviewed the patients History and Physical, chart, labs and discussed the procedure including the risks, benefits and alternatives for the proposed anesthesia with the patient or authorized representative who has indicated his/her understanding and acceptance.   Dental advisory given  Plan  Discussed with: CRNA  Anesthesia Plan Comments:         Anesthesia Quick Evaluation

## 2017-11-21 NOTE — H&P (Signed)
Janice Allen is an 77 y.o. female.   Chief Complaint: L shoulder pain and dysfunction HPI: Endstage L shoulder arthritis with significant pain and dysfunction, failed conservative measures.  Pain interferes with sleep and quality of life.   Past Medical History:  Diagnosis Date  . Anemia    hx of approx 40 years ago after prior surgery  . Anxiety   . Arthritis   . Bronchitis    approx 2 years ago  . Depression   . GERD (gastroesophageal reflux disease)   . Heart palpitations 2012   Palpitations after d/c from prior surgery. Pt thinks it was related to anxiety . denies any at this time  . History of total knee replacement    left total knee replacement  . Pneumonia    hx of    Past Surgical History:  Procedure Laterality Date  . INCONTINENCE SURGERY  2001  . KNEE ARTHROPLASTY  06/08/2011   Procedure: COMPUTER ASSISTED TOTAL KNEE ARTHROPLASTY;  Surgeon: Harvie JuniorJohn L Graves, MD;  Location: [facility] OR;  Service: Orthopedics;;  . REPLACEMENT TOTAL KNEE  2012   left knee  . TUBAL LIGATION  1980    Family History  Problem Relation Age of Onset  . Anesthesia problems Neg Hx   . Hypotension Neg Hx   . Malignant hyperthermia Neg Hx   . Pseudochol deficiency Neg Hx    Social History:  reports that she has never smoked. She has never used smokeless tobacco. She reports that she does not drink alcohol or use drugs.  Allergies: No Known Allergies  Medications Prior to Admission  Medication Sig Dispense Refill  . ALPRAZolam (XANAX) 0.25 MG tablet Take 0.125 mg by mouth at bedtime as needed for sleep.    . Cholecalciferol (VITAMIN D3) 2000 units TABS Take 2,000 Units by mouth daily.    . Glucosamine HCl-MSM (GLUCOSAMINE-MSM PO) Take 2 tablets by mouth daily. With food    . ibuprofen (ADVIL,MOTRIN) 200 MG tablet Take 200-400 mg by mouth every 8 (eight) hours as needed (for pain.).    Marland Kitchen. NON FORMULARY Place 4 drops under the tongue 3 (three) times daily as needed (for pain/sleep (scheduled in  the evening)). CBD OIL    . OVER THE COUNTER MEDICATION Take 1 capsule by mouth daily. Protandim Milk thistle (Silybum marianum) extract (225 mg) Bacopa (Bacopa monnieri) extract (150 mg) Ashwagandha (Withania somnifera) root (150 mg) Green tea (Camellia sinensis) extract (75 mg) Turmeric (Curcuma longa) extract (75 mg)    . Polyethyl Glycol-Propyl Glycol (LUBRICANT EYE DROPS) 0.4-0.3 % SOLN Place 1 drop into both eyes 3 (three) times daily as needed (for dry eyes.).      No results found for this or any previous visit (from the past 48 hour(s)). No results found.  Review of Systems  All other systems reviewed and are negative.   Blood pressure (!) 117/56, pulse 61, temperature 97.9 F (36.6 C), temperature source Oral, resp. rate 12, height 5\' 1"  (1.549 m), weight 74.3 kg, SpO2 99 %. Physical Exam  Constitutional: She is oriented to person, place, and time. She appears well-developed and well-nourished.  HENT:  Head: Atraumatic.  Eyes: EOM are normal.  Cardiovascular: Intact distal pulses.  Respiratory: Effort normal.  Musculoskeletal:  Left shoulder pain with limited range of motion.  Distally neurovascularly intact  Neurological: She is alert and oriented to person, place, and time.  Skin: Skin is warm and dry.  Psychiatric: She has a normal mood and affect.  Assessment/Plan L shoulder endstage arthritis PLan L TSA Risks / benefits of surgery discussed Consent on chart  NPO for OR Preop antibiotics   Berline LopesJustin W Cato Liburd, MD 11/21/2017, 9:18 AM

## 2017-11-21 NOTE — Op Note (Signed)
Procedure(s): LEFT TOTAL SHOULDER ARTHROPLASTY Procedure Note  Janice Allen female 77 y.o. 11/21/2017  Procedure(s) and Anesthesia Type:    * LEFT TOTAL SHOULDER ARTHROPLASTY - Choice  Surgeon(s) and Role:    Janice Allen* Janice Levit, MD - Primary   Indications:  77 y.o. female  With endstage left shoulder arthritis. Pain and dysfunction interfered with quality of life and nonoperative treatment with activity modification, NSAIDS and injections failed.     Surgeon: Janice Allen   Assistants: Damita Lackanielle Lalibert PA-C [facility](Danielle was present and scrubbed throughout the procedure and was essential in positioning, retraction, exposure, and closure)  Anesthesia: General endotracheal anesthesia with preoperative interscalene block given by attending anesthesiologist     Procedure Detail  LEFT TOTAL SHOULDER ARTHROPLASTY  Findings: Tornier flex anatomic press-fit size 2 stem with a 46 head, cemented size small 40 Cortiloc glenoid.   A lesser tuberosity osteotomy was performed and repaired at the conclusion of the procedure.  Estimated Blood Loss:  200 mL         Drains: None   Blood Given: none          Specimens: none        Complications:  * No complications entered in OR log *         Disposition: PACU - hemodynamically stable.         Condition: stable    Procedure:   The patient was identified in the preoperative holding area where I personally marked the operative extremity after verifying with the patient and consent. She  was taken to the operating room where She was transferred to the   operative table.  The patient received an interscalene block in   the holding area by the attending anesthesiologist.  General anesthesia was induced   in the operating room without complication.  The patient did receive IV  Ancef prior to the commencement of the procedure.  The patient was   placed in the beach-chair position with the back raised about 30   degrees.  The  nonoperative extremity and head and neck were carefully   positioned and padded protecting against neurovascular compromise.  The   left upper extremity was then prepped and draped in the standard sterile   fashion.    The appropriate operative time-out was performed with   Anesthesia, the perioperative staff, as well as myself and we all agreed   that the left side was the correct operative site.  The patient received 1 g IV tranexamic acid at the start of the case around time of the incision. An approximately   10 cm incision was made from the tip of the coracoid to the center point of the   humerus at the level of the axilla.  Dissection was carried down sharply   through subcutaneous tissues and cephalic vein was identified and taken   laterally with the deltoid.  The pectoralis major was taken medially.  The   upper 1 cm of the pectoralis major was released from its attachment on   the humerus.  The clavipectoral fascia was incised just lateral to the   conjoined tendon.  This incision was carried up to but not into the   coracoacromial ligament.  Digital palpation was used to prove   integrity of the axillary nerve which was protected throughout the   procedure.  Musculocutaneous nerve was not palpated in the operative   field.  Conjoined tendon was then retracted gently medially and the  deltoid laterally.  Anterior circumflex humeral vessels were clamped and   coagulated.  The soft tissues overlying the biceps was incised and this   incision was carried across the transverse humeral ligament to the base   of the coracoid.  The biceps was noted to be severely degenerated. It was released from the superior labrum. The biceps was then tenodesed to the soft tissue just above   pectoralis major and the remaining portion of the biceps superiorly was   excised.  An osteotomy was performed at the lesser tuberosity.  The capsule was then   released all the way down to the 6 o'clock position  of the humeral head.   The humeral head was then delivered with simultaneous adduction,   extension and external rotation.  All humeral osteophytes were removed   and the anatomic neck of the humerus was marked and cut free hand at   approximately 25 degrees retroversion within about 3 mm of the cuff   reflection posteriorly.  The head size was estimated to be a 46 medium   offset.  At that point, the humeral head was retracted posteriorly with   a Fukuda retractor.   Remaining portion of the capsule was released at the base of the   coracoid.  The remaining biceps anchor and the entire anterior-inferior   labrum was excised.  The posterior labrum was also excised but the   posterior capsule was not released.  The guidepin was placed bicortically with non elevated guide.  The reamer was used to ream to concentric bone with punctate bleeding.  This gave an excellent concentric surface.  The center hole was then drilled for an anchor peg glenoid followed by the three peripheral holes and none of the holes   exited the glenoid wall.  I then pulse irrigated these holes and dried   them with Surgicel.  The three peripheral holes were then   pressurized cemented and the anchor peg glenoid was placed and impacted   with an excellent fit.  The glenoid was a 40s component.  The proximal humerus was then again exposed taking care not to displace the glenoid.    The entry awl was used followed by sounding reamers and then sequentially broached from size 1-2. This was then left in place and the calcar planer was used. Trial head was placed with a 46.  With the trial implantation of the component,  there was approximately 50% posterior translation with immediate snap back to the   anatomic position.  With forward elevation, there was no tendency   towards posterior subluxation.   The trial was removed and the final implant was prepared on a back table.  The trial was removed and the final implant was prepared  on a back table.   3 small holes were drilled on the medial side of the lesser tuberosity osteotomy, through which 2 labral tapes were passed. The implant was then placed through the loop of the 2 labral tapes and impacted with an excellent press-fit. This achieved excellent anatomic reconstruction of the proximal humerus.  The joint was then copiously irrigated with pulse lavage.  The subscapularis and   lesser tuberosity osteotomy were then repaired using the 2 labral tapes previously passed in a double row fashion with horizontal mattress sutures medially brought over through bone tunnels tied over a bone bridge laterally.   One #1 Ethibond was placed at the rotator interval just above   the lesser tuberosity. Copious irrigation was  used. Skin was closed with 2-0 Vicryl sutures in the deep dermal layer and 4-0 Monocryl in a subcuticular  running fashion.  Sterile dressings were then applied including Aquacel.  The patient was placed in a sling and allowed to awaken from general anesthesia and taken to the recovery room in stable condition.      POSTOPERATIVE PLAN:  Early passive range of motion will be allowed with the goal of 0 degrees external rotation and 90 degrees forward elevation.  No internal rotation at this time.  No active motion of the arm until the lesser tuberosity heals.  The patient will likely be kept in the hospital for 1-2 days and then discharged home.

## 2017-11-22 ENCOUNTER — Encounter (HOSPITAL_COMMUNITY): Payer: Self-pay | Admitting: Orthopedic Surgery

## 2017-11-22 LAB — CBC
HCT: 35.4 % — ABNORMAL LOW (ref 36.0–46.0)
HEMOGLOBIN: 11.4 g/dL — AB (ref 12.0–15.0)
MCH: 28.9 pg (ref 26.0–34.0)
MCHC: 32.2 g/dL (ref 30.0–36.0)
MCV: 89.6 fL (ref 78.0–100.0)
PLATELETS: 151 10*3/uL (ref 150–400)
RBC: 3.95 MIL/uL (ref 3.87–5.11)
RDW: 13.2 % (ref 11.5–15.5)
WBC: 10.1 10*3/uL (ref 4.0–10.5)

## 2017-11-22 LAB — BASIC METABOLIC PANEL
ANION GAP: 7 (ref 5–15)
BUN: 16 mg/dL (ref 8–23)
CHLORIDE: 105 mmol/L (ref 98–111)
CO2: 25 mmol/L (ref 22–32)
Calcium: 8.7 mg/dL — ABNORMAL LOW (ref 8.9–10.3)
Creatinine, Ser: 0.72 mg/dL (ref 0.44–1.00)
GFR calc Af Amer: >60 mL/min (ref >60)
Glucose, Bld: 142 mg/dL — ABNORMAL HIGH (ref 70–99)
Potassium: 4 mmol/L (ref 3.5–5.1)
Sodium: 137 mmol/L (ref 135–145)

## 2017-11-22 MED ORDER — HYDROCODONE-ACETAMINOPHEN 5-325 MG PO TABS: 1.0000 | ORAL_TABLET | ORAL | 0 refills | Status: DC | PRN

## 2017-11-22 MED ORDER — ONDANSETRON HCL 4 MG PO TABS: 4.0000 mg | ORAL_TABLET | Freq: Three times a day (TID) | ORAL | 1 refills | Status: AC | PRN

## 2017-11-22 NOTE — Progress Notes (Signed)
Patient being discharged to home,scripts given for pain med and anti nausea medication.Instructions given as to when and how often to take medicine.Discharge instructions given for follow up appointment with physician.Patient advised if chest pain or any emergency arises call 911 and go to emergency room.

## 2017-11-22 NOTE — Discharge Instructions (Signed)
Discharge Instructions after Total Shoulder Arthroplasty ° ° °A sling has been provided for you. Remove the sling 5 times each day to perform motion exercises. You should use the sling as a protective device, if you are in a crowd.  °Use ice on the shoulder intermittently over the first 48 hours after surgery.  °Pain medication has been prescribed for you.  °Use your medication liberally over the first 48 hours, and then begin to taper your use. You may take Extra Strength Tylenol or Tylenol only in place of the pain pills. DO NOT take ANY nonsteroidal anti-inflammatory pain medications: Advil, Motrin, Ibuprofen, Aleve, Naproxen, or Naprosyn. °Take one aspirin a day for 2 weeks after surgery, unless you have an aspirin sensitivity/allergy or asthma. °Leave your dressing on until your first follow up visit.  You may shower with the dressing.  Hold your arm as if you still have your sling on while you shower. °Active reaching and lifting are not permitted. You may use the operative arm for activities of daily living that do not require the operative arm to leave the side of the body, such as eating, drinking, bathing, etc.  °Three to 5 times each day you should perform assisted overhead reaching and external rotation (outward turning) exercises with the operative arm. You were taught these exercises prior to discharge. Both exercises should be done with the non-operative arm used as the "therapist arm" while the operative arm remains relaxed. Ten of each exercise should be done three to five times each day. ° ° °Overhead reach is helping to lift your stiff arm up as high as it will go. To stretch your overhead reach, lie flat on your back, relax, and grasp the wrist of the tight shoulder with your opposite hand. Using the power in your opposite arm, bring the stiff arm up as far as it is comfortable. Start holding it for ten seconds and then work up to where you can hold it for a count of 30. Breathe slowly and deeply  while the arm is moved. Repeat this stretch ten times, trying to help the ar up a little higher each time.  ° ° ° °External rotation is turning the arm out to the side while your elbow stays close to your body. External rotation is best stretched while you are lying on your back. Hold a cane, yardstick, broom handle, or dowel in both hands. Bend both elbows to a right angle. Use steady, gentle force from your normal arm to rotate the hand of the stiff shoulder out away from your body. Continue the rotation as far as it will go comfortably, holding it there for a count of 10. Repeat this exercise ten times.  ° ° ° ° °Please call [phone] during normal business hours or [phone] after hours for any problems. Including the following: ° °- excessive redness of the incisions °- drainage for more than 4 days °- fever of more than 101.5 F ° °*Please note that pain medications will not be refilled after hours or on weekends. ° ° ° °

## 2017-11-22 NOTE — Progress Notes (Signed)
   PATIENT ID: Janice Allen   1 Day Post-Op Procedure(s) (LRB): LEFT TOTAL SHOULDER ARTHROPLASTY (Left)  Subjective: Doing well. Block still working, minimal pain. Feels good to go home today.   Objective:  Vitals:   11/22/17 0024 11/22/17 0422  BP: 123/69 125/63  Pulse: 71 76  Resp:  16  Temp: 98.3 F (36.8 C) 97.6 F (36.4 C)  SpO2: 96% 97%     L UE dressing c/d/i Wiggles fingers, distally NVI  Labs:  Recent Labs    11/22/17 0314  HGB 11.4*   Recent Labs    11/22/17 0314  WBC 10.1  RBC 3.95  HCT 35.4*  PLT 151   Recent Labs    11/22/17 0314  NA 137  K 4.0  CL 105  CO2 25  BUN 16  CREATININE 0.72  GLUCOSE 142*  CALCIUM 8.7*    Assessment and Plan: 1 day s/p L TSA OT- goal to PROM 90 FF, 0 ER Sling D/c home when cleared by OT Fu with Dr. Ave Filterhandler in 2 weeks  VTE proph: asa, scds

## 2017-11-22 NOTE — Discharge Summary (Signed)
Patient ID: Janice Allen MRN: [id number] DOB/AGE: January 02, 1941 77 y.o.  Admit date: 11/21/2017 Discharge date: 11/22/2017  Admission Diagnoses:  Active Problems:   S/P shoulder replacement, left   Discharge Diagnoses:  Same  Past Medical History:  Diagnosis Date  . Anemia    hx of approx 40 years ago after prior surgery  . Anxiety   . Arthritis   . Bronchitis    approx 2 years ago  . Depression   . GERD (gastroesophageal reflux disease)   . Heart palpitations 2012   Palpitations after d/c from prior surgery. Pt thinks it was related to anxiety . denies any at this time  . History of total knee replacement    left total knee replacement  . Pneumonia    hx of    Surgeries: Procedure(s): LEFT TOTAL SHOULDER ARTHROPLASTY on 11/21/2017   Consultants:   Discharged Condition: Improved  Hospital Course: Janice CrazeBillie J Allen is an 77 y.o. female who was admitted 11/21/2017 for operative treatment of left shoulder OA. Patient has severe unremitting pain that affects sleep, daily activities, and work/hobbies. After pre-op clearance the patient was taken to the operating room on 11/21/2017 and underwent  Procedure(s): LEFT TOTAL SHOULDER ARTHROPLASTY.    Patient was given perioperative antibiotics:  Anti-infectives (From admission, onward)   Start     Dose/Rate Route Frequency Ordered Stop   11/21/17 1600  ceFAZolin (ANCEF) IVPB 1 g/50 mL premix     1 g 100 mL/hr over 30 Minutes Intravenous Every 6 hours 11/21/17 1336 11/22/17 0520   11/21/17 0715  ceFAZolin (ANCEF) IVPB 2g/100 mL premix     2 g 200 mL/hr over 30 Minutes Intravenous On call to O.R. 11/21/17 0704 11/21/17 1005   11/21/17 0712  ceFAZolin (ANCEF) 2-4 GM/100ML-% IVPB    Note to Pharmacy:  Sandi RavelingSchonewitz, Leigh   : cabinet override      11/21/17 0712 11/21/17 0935       Patient was given sequential compression devices, early ambulation, and chemoprophylaxis to prevent DVT.  Patient benefited maximally from hospital  stay and there were no complications.    Recent vital signs:  Patient Vitals for the past 24 hrs:  BP Temp Temp src Pulse Resp SpO2  11/22/17 0422 125/63 97.6 F (36.4 C) Oral 76 16 97 %  11/22/17 0024 123/69 98.3 F (36.8 C) Oral 71 - 96 %  11/21/17 1932 120/70 98.4 F (36.9 C) Oral 74 18 96 %  11/21/17 1519 138/74 98 F (36.7 C) Oral 74 18 96 %  11/21/17 1300 136/74 (!) 97 F (36.1 C) - 74 20 -  11/21/17 1200 126/66 (!) 97.5 F (36.4 C) - 64 15 100 %  11/21/17 1145 118/73 - - 64 16 100 %  11/21/17 1129 130/71 - - 72 17 97 %  11/21/17 1115 (!) 141/74 97.6 F (36.4 C) - 71 18 98 %  11/21/17 0855 (!) 117/56 - - 61 12 99 %  11/21/17 0850 - - - 63 16 93 %  11/21/17 0845 - - - 69 20 100 %  11/21/17 0840 - - - 67 16 97 %     Recent laboratory studies:  Recent Labs    11/22/17 0314  WBC 10.1  HGB 11.4*  HCT 35.4*  PLT 151  NA 137  K 4.0  CL 105  CO2 25  BUN 16  CREATININE 0.72  GLUCOSE 142*  CALCIUM 8.7*     Discharge Medications:   Allergies as of 11/22/2017  No Known Allergies     Medication List    STOP taking these medications   ibuprofen 200 MG tablet Commonly known as:  ADVIL,MOTRIN     TAKE these medications   ALPRAZolam 0.25 MG tablet Commonly known as:  XANAX Take 0.125 mg by mouth at bedtime as needed for sleep.   GLUCOSAMINE-MSM PO Take 2 tablets by mouth daily. With food   HYDROcodone-acetaminophen 5-325 MG tablet Commonly known as:  NORCO/VICODIN Take 1-2 tablets by mouth every 4 (four) hours as needed for moderate pain.   LUBRICANT EYE DROPS 0.4-0.3 % Soln Generic drug:  Polyethyl Glycol-Propyl Glycol Place 1 drop into both eyes 3 (three) times daily as needed (for dry eyes.).   NON FORMULARY Place 4 drops under the tongue 3 (three) times daily as needed (for pain/sleep (scheduled in the evening)). CBD OIL   ondansetron 4 MG tablet Commonly known as:  ZOFRAN Take 1 tablet (4 mg total) by mouth every 8 (eight) hours as needed for  nausea or vomiting.   OVER THE COUNTER MEDICATION Take 1 capsule by mouth daily. Protandim Milk thistle (Silybum marianum) extract (225 mg) Bacopa (Bacopa monnieri) extract (150 mg) Ashwagandha (Withania somnifera) root (150 mg) Green tea (Camellia sinensis) extract (75 mg) Turmeric (Curcuma longa) extract (75 mg)   Vitamin D3 2000 units Tabs Take 2,000 Units by mouth daily.       Diagnostic Studies: Dg Chest 2 View  Result Date: 11/13/2017 CLINICAL DATA:  Preop for left shoulder replacement. EXAM: CHEST - 2 VIEW COMPARISON:  05/31/2011 FINDINGS: The cardiac silhouette is normal in size. The thoracic aorta is tortuous. Rounded density projecting over the right lower lung on the PA radiograph is favored to reflect an interval chronic posterior ninth rib fracture. No airspace consolidation, edema, pleural effusion, or pneumothorax is identified. Mild thoracic disc degeneration is noted. IMPRESSION: 1. No evidence of active cardiopulmonary disease. 2. Interval chronic posterior right ninth rib fracture. Electronically Signed   By: Sebastian Ache M.D.   On: 11/13/2017 08:50   Ct Shoulder Left Wo Contrast  Result Date: 10/28/2017 CLINICAL DATA:  Left shoulder osteoarthritis. EXAM: CT OF THE UPPER LEFT EXTREMITY WITHOUT CONTRAST TECHNIQUE: Multidetector CT imaging of the upper left extremity was performed according to the standard protocol. COMPARISON:  None. FINDINGS: Bones/Joint/Cartilage No fracture or dislocation. Normal alignment. No joint effusion. Severe osteoarthritis of the glenohumeral joint with severe joint space narrowing, bone-on-bone appearance, subchondral sclerosis, subchondral cystic changes and marginal osteophytosis. Small joint effusion with tiny loose body in the subscapularis recess. Minimal arthropathy of the acromioclavicular joint. Type I acromion. Ligaments Ligaments are suboptimally evaluated by CT. Muscles and Tendons Muscles are normal. No muscle atrophy. The rotator cuff  tendons are grossly intact. Soft tissue No fluid collection or hematoma.  No soft tissue mass. IMPRESSION: 1. Severe glenohumeral osteoarthritis.  No acute abnormality. Electronically Signed   By: Obie Dredge M.D.   On: 10/28/2017 16:51   Dg Shoulder Left Port  Result Date: 11/21/2017 CLINICAL DATA:  Status post left shoulder arthroplasty. EXAM: LEFT SHOULDER - 1 VIEW COMPARISON:  Left shoulder CT dated 10/28/2017. FINDINGS: Interval left shoulder prosthesis in satisfactory position and alignment. Small corticated ossicle inferior to the humeral neck. No fracture or dislocation IMPRESSION: Satisfactory postoperative appearance of a left shoulder prosthesis. Electronically Signed   By: Beckie Salts M.D.   On: 11/21/2017 11:28    Disposition:     Follow-up Information    Jackquline Bosch, MD. Schedule an appointment as  soon as possible for a visit in 2 week(s).   Contact information: [address] [place] [address] [phone]            Signed: Jiles HaroldLALIBERTE, Denis Carreon 11/22/2017, 8:20 AM

## 2017-11-22 NOTE — Plan of Care (Signed)

## 2017-11-22 NOTE — Evaluation (Signed)
Occupational Therapy Evaluation and Discharge Patient Details Name: Janice Allen MRN: [id number] DOB: 10-Jan-1941 Today's Date: 11/22/2017    History of Present Illness s/p L TSA   Clinical Impression   Pt and friend educated in compensatory strategies for ADL, positioning L UE in bed and chair, sling use, exercises as per MD order with pt returning demonstration. No further OT needs. Pt is eager to discharge.    Follow Up Recommendations  Follow surgeon's recommendation for DC plan and follow-up therapies    Equipment Recommendations  None recommended by OT    Recommendations for Other Services       Precautions / Restrictions Precautions Precautions: Shoulder Type of Shoulder Precautions: PROM to 90, ER to neutral, no abduction of L shoulder, elbow to hand AROM Shoulder Interventions: Shoulder sling/immobilizer;Off for dressing/bathing/exercises Precaution Booklet Issued: Yes (comment) Required Braces or Orthoses: Sling Restrictions Weight Bearing Restrictions: Yes LUE Weight Bearing: Non weight bearing      Mobility Bed Mobility Overal bed mobility: Modified Independent             General bed mobility comments: up to R side of bed to avoid weight bearing on L UE  Transfers Overall transfer level: Modified independent               General transfer comment: stabilizes on bed/chair when standing, no assist needed    Balance                                           ADL either performed or assessed with clinical judgement   ADL Overall ADL's : Modified independent                                       General ADL Comments: Pt demonstrated ability to perform toileting, standing grooming and dressing adhering to shoulder precautions. Verbalized understanding of how to bathe. Educated pt and friend in donning and doffing sling and positioning L UE in bed and chair for comfort.      Vision Baseline Vision/History:  Wears glasses Wears Glasses: At all times Patient Visual Report: No change from baseline       Perception     Praxis      Pertinent Vitals/Pain       Hand Dominance Right   Extremity/Trunk Assessment Upper Extremity Assessment Upper Extremity Assessment: RUE deficits/detail;LUE deficits/detail RUE Deficits / Details: arthritic changes in hand RUE Coordination: decreased fine motor LUE Deficits / Details: performed self ROM in supine x 5 FF to 90, ER to neutral, AAROM of elbow to hand in standing, nerve block still effective LUE Coordination: decreased fine motor;decreased gross motor   Lower Extremity Assessment Lower Extremity Assessment: Overall WFL for tasks assessed   Cervical / Trunk Assessment Cervical / Trunk Assessment: Normal   Communication Communication Communication: No difficulties   Cognition Arousal/Alertness: Awake/alert Behavior During Therapy: WFL for tasks assessed/performed Overall Cognitive Status: Within Functional Limits for tasks assessed                                     General Comments       Exercises     Shoulder Instructions      Home Living Family/patient  expects to be discharged to:: Private residence Living Arrangements: Alone Available Help at Discharge: Friend(s);Available 24 hours/day Type of Home: House                           Additional Comments: pt going to friend's home       Prior Functioning/Environment Level of Independence: Independent                 OT Problem List: Pain;Impaired UE functional use;Decreased range of motion;Decreased strength      OT Treatment/Interventions:      OT Goals(Current goals can be found in the care plan section) Acute Rehab OT Goals Patient Stated Goal: to regain use of L UE  OT Frequency:     Barriers to D/C:            Co-evaluation              AM-PAC PT "6 Clicks" Daily Activity     Outcome Measure Help from another person  eating meals?: None Help from another person taking care of personal grooming?: None Help from another person toileting, which includes using toliet, bedpan, or urinal?: None Help from another person bathing (including washing, rinsing, drying)?: None Help from another person to put on and taking off regular upper body clothing?: A Little Help from another person to put on and taking off regular lower body clothing?: None 6 Click Score: 23   End of Session Nurse Communication: Other (comment)(ready to go home)  Activity Tolerance: Patient tolerated treatment well Patient left: in chair;with call bell/phone within reach;with family/visitor present  OT Visit Diagnosis: Pain                Time: 1014-1101 OT Time Calculation (min): 47 min Charges:  OT General Charges $OT Visit: 1 Visit OT Evaluation $OT Eval Low Complexity: 1 Low OT Treatments $Self Care/Home Management : 8-22 mins $Therapeutic Exercise: 8-22 mins  11/22/2017 Martie RoundJulie Yarixa Lightcap, OTR/L Pager: [phone]  Evern BioMayberry, Mckay Tegtmeyer Lynn 11/22/2017, 11:55 AM

## 2017-12-04 DIAGNOSIS — Z471 Aftercare following joint replacement surgery: Secondary | ICD-10-CM | POA: Diagnosis not present

## 2017-12-04 DIAGNOSIS — E559 Vitamin D deficiency, unspecified: Secondary | ICD-10-CM | POA: Diagnosis not present

## 2017-12-04 DIAGNOSIS — M19012 Primary osteoarthritis, left shoulder: Secondary | ICD-10-CM | POA: Diagnosis not present

## 2017-12-04 DIAGNOSIS — Z96612 Presence of left artificial shoulder joint: Secondary | ICD-10-CM | POA: Diagnosis not present

## 2017-12-30 DIAGNOSIS — Z96612 Presence of left artificial shoulder joint: Secondary | ICD-10-CM | POA: Diagnosis not present

## 2017-12-30 DIAGNOSIS — Z471 Aftercare following joint replacement surgery: Secondary | ICD-10-CM | POA: Diagnosis not present

## 2017-12-30 DIAGNOSIS — M19012 Primary osteoarthritis, left shoulder: Secondary | ICD-10-CM | POA: Diagnosis not present

## 2017-12-31 DIAGNOSIS — Z96612 Presence of left artificial shoulder joint: Secondary | ICD-10-CM | POA: Diagnosis not present

## 2017-12-31 DIAGNOSIS — M25612 Stiffness of left shoulder, not elsewhere classified: Secondary | ICD-10-CM | POA: Diagnosis not present

## 2018-01-02 DIAGNOSIS — Z96612 Presence of left artificial shoulder joint: Secondary | ICD-10-CM | POA: Diagnosis not present

## 2018-01-02 DIAGNOSIS — M25612 Stiffness of left shoulder, not elsewhere classified: Secondary | ICD-10-CM | POA: Diagnosis not present

## 2018-01-06 DIAGNOSIS — Z96612 Presence of left artificial shoulder joint: Secondary | ICD-10-CM | POA: Diagnosis not present

## 2018-01-06 DIAGNOSIS — M25612 Stiffness of left shoulder, not elsewhere classified: Secondary | ICD-10-CM | POA: Diagnosis not present

## 2018-01-09 DIAGNOSIS — Z96612 Presence of left artificial shoulder joint: Secondary | ICD-10-CM | POA: Diagnosis not present

## 2018-01-09 DIAGNOSIS — M25612 Stiffness of left shoulder, not elsewhere classified: Secondary | ICD-10-CM | POA: Diagnosis not present

## 2018-01-14 DIAGNOSIS — Z96612 Presence of left artificial shoulder joint: Secondary | ICD-10-CM | POA: Diagnosis not present

## 2018-01-14 DIAGNOSIS — M25612 Stiffness of left shoulder, not elsewhere classified: Secondary | ICD-10-CM | POA: Diagnosis not present

## 2018-01-17 DIAGNOSIS — M25612 Stiffness of left shoulder, not elsewhere classified: Secondary | ICD-10-CM | POA: Diagnosis not present

## 2018-01-17 DIAGNOSIS — Z96612 Presence of left artificial shoulder joint: Secondary | ICD-10-CM | POA: Diagnosis not present

## 2018-01-22 LAB — GLUCOSE, POCT (MANUAL RESULT ENTRY): POC GLUCOSE: 101 mg/dL — AB (ref 70–99)

## 2018-01-24 DIAGNOSIS — Z96612 Presence of left artificial shoulder joint: Secondary | ICD-10-CM | POA: Diagnosis not present

## 2018-01-24 DIAGNOSIS — M25612 Stiffness of left shoulder, not elsewhere classified: Secondary | ICD-10-CM | POA: Diagnosis not present

## 2018-01-27 DIAGNOSIS — M25612 Stiffness of left shoulder, not elsewhere classified: Secondary | ICD-10-CM | POA: Diagnosis not present

## 2018-01-27 DIAGNOSIS — Z96612 Presence of left artificial shoulder joint: Secondary | ICD-10-CM | POA: Diagnosis not present

## 2018-01-30 DIAGNOSIS — Z96612 Presence of left artificial shoulder joint: Secondary | ICD-10-CM | POA: Diagnosis not present

## 2018-01-30 DIAGNOSIS — M25612 Stiffness of left shoulder, not elsewhere classified: Secondary | ICD-10-CM | POA: Diagnosis not present

## 2018-02-03 DIAGNOSIS — Z96612 Presence of left artificial shoulder joint: Secondary | ICD-10-CM | POA: Diagnosis not present

## 2018-02-03 DIAGNOSIS — M25612 Stiffness of left shoulder, not elsewhere classified: Secondary | ICD-10-CM | POA: Diagnosis not present

## 2018-02-04 DIAGNOSIS — M5416 Radiculopathy, lumbar region: Secondary | ICD-10-CM | POA: Diagnosis not present

## 2018-02-04 DIAGNOSIS — M9905 Segmental and somatic dysfunction of pelvic region: Secondary | ICD-10-CM | POA: Diagnosis not present

## 2018-02-04 DIAGNOSIS — M9903 Segmental and somatic dysfunction of lumbar region: Secondary | ICD-10-CM | POA: Diagnosis not present

## 2018-02-04 DIAGNOSIS — M955 Acquired deformity of pelvis: Secondary | ICD-10-CM | POA: Diagnosis not present

## 2018-02-06 DIAGNOSIS — Z96612 Presence of left artificial shoulder joint: Secondary | ICD-10-CM | POA: Diagnosis not present

## 2018-02-06 DIAGNOSIS — M25612 Stiffness of left shoulder, not elsewhere classified: Secondary | ICD-10-CM | POA: Diagnosis not present

## 2018-02-17 DIAGNOSIS — M25612 Stiffness of left shoulder, not elsewhere classified: Secondary | ICD-10-CM | POA: Diagnosis not present

## 2018-02-17 DIAGNOSIS — Z96612 Presence of left artificial shoulder joint: Secondary | ICD-10-CM | POA: Diagnosis not present

## 2018-02-20 DIAGNOSIS — M25612 Stiffness of left shoulder, not elsewhere classified: Secondary | ICD-10-CM | POA: Diagnosis not present

## 2018-02-20 DIAGNOSIS — Z96612 Presence of left artificial shoulder joint: Secondary | ICD-10-CM | POA: Diagnosis not present

## 2018-02-24 DIAGNOSIS — Z96612 Presence of left artificial shoulder joint: Secondary | ICD-10-CM | POA: Diagnosis not present

## 2018-02-24 DIAGNOSIS — M25612 Stiffness of left shoulder, not elsewhere classified: Secondary | ICD-10-CM | POA: Diagnosis not present

## 2018-03-03 DIAGNOSIS — M955 Acquired deformity of pelvis: Secondary | ICD-10-CM | POA: Diagnosis not present

## 2018-03-03 DIAGNOSIS — M5416 Radiculopathy, lumbar region: Secondary | ICD-10-CM | POA: Diagnosis not present

## 2018-03-03 DIAGNOSIS — M9905 Segmental and somatic dysfunction of pelvic region: Secondary | ICD-10-CM | POA: Diagnosis not present

## 2018-03-03 DIAGNOSIS — M9903 Segmental and somatic dysfunction of lumbar region: Secondary | ICD-10-CM | POA: Diagnosis not present

## 2018-03-04 DIAGNOSIS — M25612 Stiffness of left shoulder, not elsewhere classified: Secondary | ICD-10-CM | POA: Diagnosis not present

## 2018-03-04 DIAGNOSIS — Z96612 Presence of left artificial shoulder joint: Secondary | ICD-10-CM | POA: Diagnosis not present

## 2018-03-10 DIAGNOSIS — M25612 Stiffness of left shoulder, not elsewhere classified: Secondary | ICD-10-CM | POA: Diagnosis not present

## 2018-03-10 DIAGNOSIS — Z96612 Presence of left artificial shoulder joint: Secondary | ICD-10-CM | POA: Diagnosis not present

## 2018-03-13 DIAGNOSIS — Z96612 Presence of left artificial shoulder joint: Secondary | ICD-10-CM | POA: Diagnosis not present

## 2018-03-13 DIAGNOSIS — R69 Illness, unspecified: Secondary | ICD-10-CM | POA: Diagnosis not present

## 2018-03-13 DIAGNOSIS — M25612 Stiffness of left shoulder, not elsewhere classified: Secondary | ICD-10-CM | POA: Diagnosis not present

## 2018-03-17 DIAGNOSIS — M25612 Stiffness of left shoulder, not elsewhere classified: Secondary | ICD-10-CM | POA: Diagnosis not present

## 2018-03-17 DIAGNOSIS — Z96612 Presence of left artificial shoulder joint: Secondary | ICD-10-CM | POA: Diagnosis not present

## 2018-03-19 DIAGNOSIS — M955 Acquired deformity of pelvis: Secondary | ICD-10-CM | POA: Diagnosis not present

## 2018-03-19 DIAGNOSIS — M5416 Radiculopathy, lumbar region: Secondary | ICD-10-CM | POA: Diagnosis not present

## 2018-03-19 DIAGNOSIS — M9905 Segmental and somatic dysfunction of pelvic region: Secondary | ICD-10-CM | POA: Diagnosis not present

## 2018-03-19 DIAGNOSIS — M9903 Segmental and somatic dysfunction of lumbar region: Secondary | ICD-10-CM | POA: Diagnosis not present

## 2018-11-10 IMAGING — DX DG SHOULDER 1V*L*
1 series · 1 of 1 positions shown · non-contrast
Comparison: Left shoulder CT dated 10/28/2017.

CLINICAL DATA: Status post left shoulder arthroplasty.

EXAM:
LEFT SHOULDER - 1 VIEW

[shoulder]
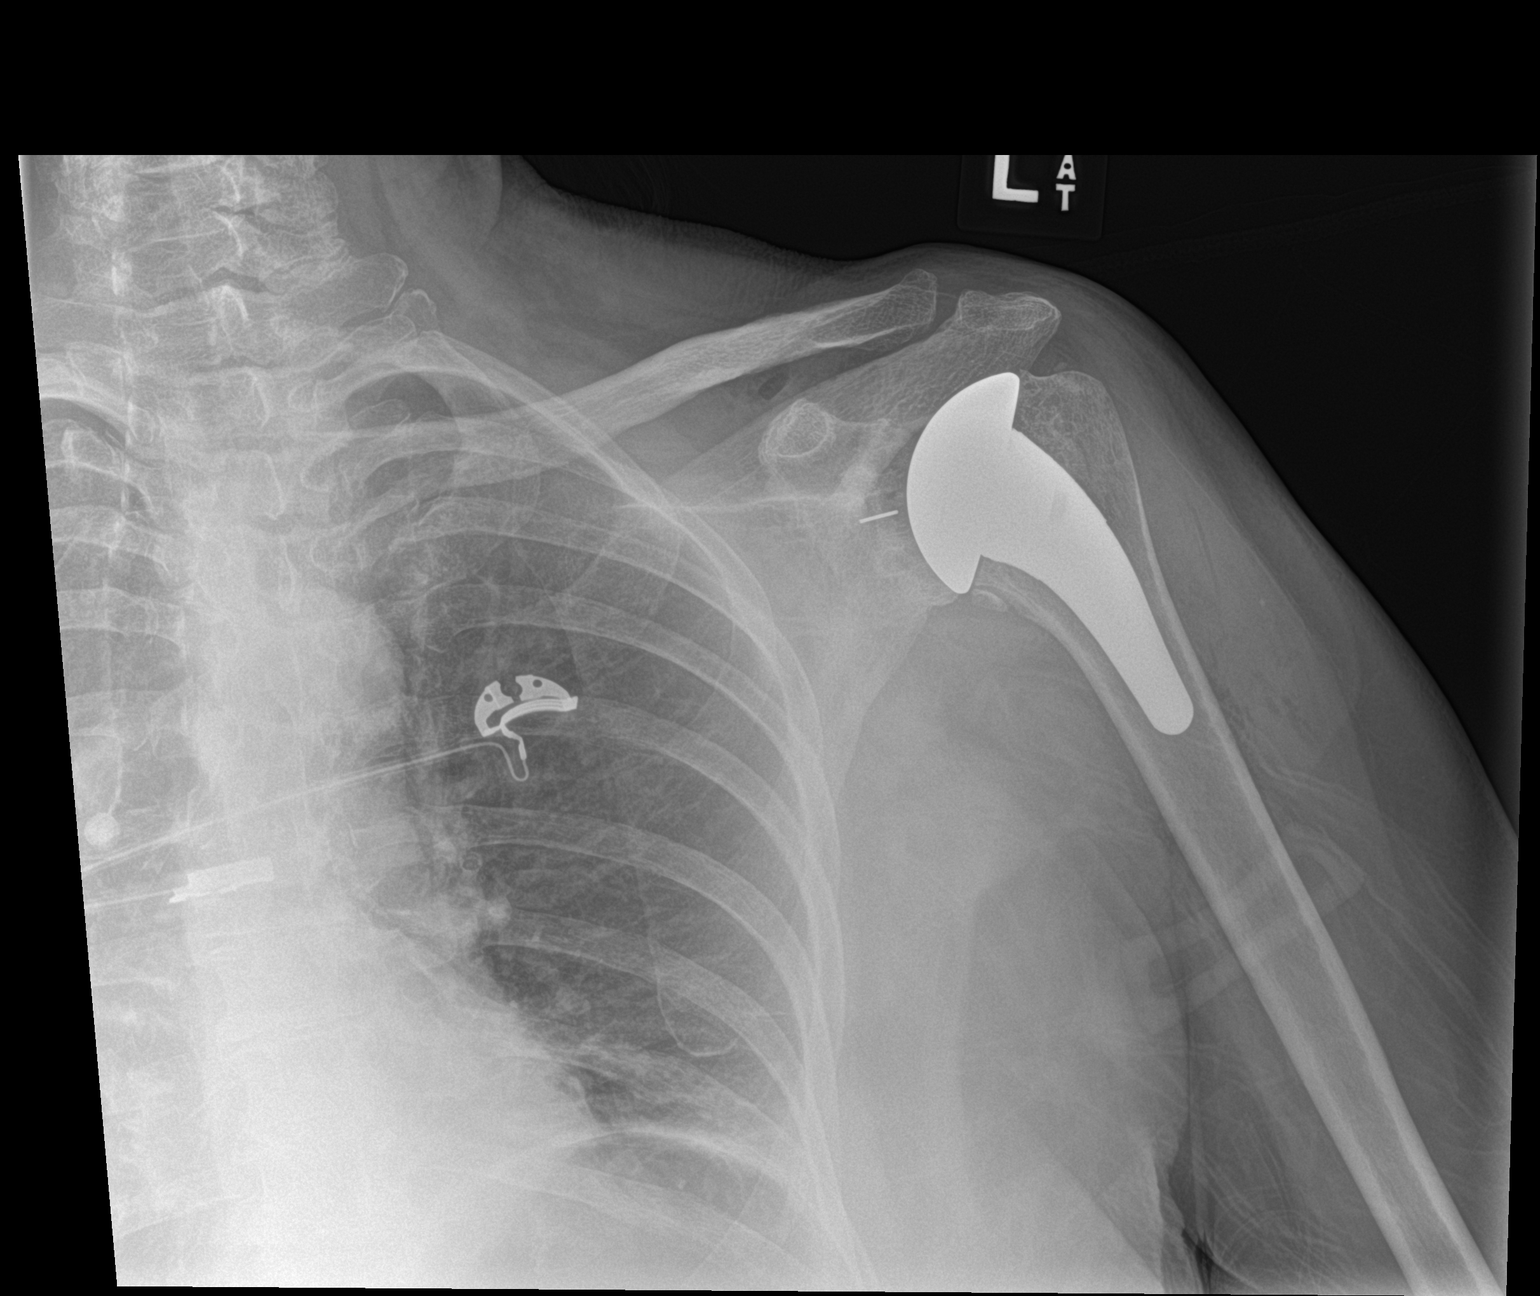

[1 of 1 positions shown; findings below may reference images not displayed]

FINDINGS: Interval left shoulder prosthesis in satisfactory position and
alignment. Small corticated ossicle inferior to the humeral neck. No
fracture or dislocation
IMPRESSION: Satisfactory postoperative appearance of a left shoulder prosthesis.

## 2018-12-12 ENCOUNTER — Other Ambulatory Visit: Payer: Self-pay

## 2018-12-12 ENCOUNTER — Other Ambulatory Visit: Payer: Self-pay | Admitting: Family Medicine

## 2018-12-12 ENCOUNTER — Ambulatory Visit
Admission: RE | Admit: 2018-12-12 | Discharge: 2018-12-12 | Disposition: A | Discharge status: Home / Self Care | Payer: Medicare Other | Source: Ambulatory Visit | Attending: Family Medicine | Admitting: Family Medicine

## 2018-12-12 DIAGNOSIS — M48061 Spinal stenosis, lumbar region without neurogenic claudication: Secondary | ICD-10-CM

## 2018-12-13 ENCOUNTER — Emergency Department (HOSPITAL_COMMUNITY): Payer: Medicare Other

## 2018-12-13 ENCOUNTER — Inpatient Hospital Stay (HOSPITAL_COMMUNITY)
Admission: EM | Admit: 2018-12-13 | Discharge: 2018-12-16 | DRG: 287 | Disposition: A | Discharge status: Home / Self Care | Payer: Medicare Other | Attending: Internal Medicine | Admitting: Internal Medicine

## 2018-12-13 ENCOUNTER — Other Ambulatory Visit: Payer: Self-pay

## 2018-12-13 DIAGNOSIS — R079 Chest pain, unspecified: Secondary | ICD-10-CM | POA: Diagnosis not present

## 2018-12-13 DIAGNOSIS — I200 Unstable angina: Secondary | ICD-10-CM | POA: Diagnosis present

## 2018-12-13 DIAGNOSIS — Z823 Family history of stroke: Secondary | ICD-10-CM

## 2018-12-13 DIAGNOSIS — Z96652 Presence of left artificial knee joint: Secondary | ICD-10-CM | POA: Diagnosis present

## 2018-12-13 DIAGNOSIS — R0789 Other chest pain: Secondary | ICD-10-CM | POA: Diagnosis not present

## 2018-12-13 DIAGNOSIS — Z96612 Presence of left artificial shoulder joint: Secondary | ICD-10-CM | POA: Diagnosis present

## 2018-12-13 DIAGNOSIS — I083 Combined rheumatic disorders of mitral, aortic and tricuspid valves: Secondary | ICD-10-CM | POA: Diagnosis present

## 2018-12-13 DIAGNOSIS — E785 Hyperlipidemia, unspecified: Secondary | ICD-10-CM | POA: Diagnosis present

## 2018-12-13 DIAGNOSIS — K219 Gastro-esophageal reflux disease without esophagitis: Secondary | ICD-10-CM | POA: Diagnosis present

## 2018-12-13 DIAGNOSIS — R7989 Other specified abnormal findings of blood chemistry: Secondary | ICD-10-CM | POA: Diagnosis present

## 2018-12-13 DIAGNOSIS — Z20828 Contact with and (suspected) exposure to other viral communicable diseases: Secondary | ICD-10-CM | POA: Diagnosis present

## 2018-12-13 DIAGNOSIS — F419 Anxiety disorder, unspecified: Secondary | ICD-10-CM | POA: Diagnosis present

## 2018-12-13 DIAGNOSIS — Z800 Family history of malignant neoplasm of digestive organs: Secondary | ICD-10-CM

## 2018-12-13 DIAGNOSIS — R778 Other specified abnormalities of plasma proteins: Secondary | ICD-10-CM | POA: Diagnosis not present

## 2018-12-13 DIAGNOSIS — Z8249 Family history of ischemic heart disease and other diseases of the circulatory system: Secondary | ICD-10-CM

## 2018-12-13 LAB — COMPREHENSIVE METABOLIC PANEL
ALT: 15 U/L (ref 0–44)
AST: 19 U/L (ref 15–41)
Albumin: 4.2 g/dL (ref 3.5–5.0)
Alkaline Phosphatase: 70 U/L (ref 38–126)
Anion gap: 11 (ref 5–15)
BUN: 23 mg/dL (ref 8–23)
CO2: 24 mmol/L (ref 22–32)
Calcium: 10 mg/dL (ref 8.9–10.3)
Chloride: 102 mmol/L (ref 98–111)
Creatinine, Ser: 0.82 mg/dL (ref 0.44–1.00)
GFR calc Af Amer: >60 mL/min (ref >60)
GFR calc non Af Amer: >60 mL/min (ref >60)
Glucose, Bld: 112 mg/dL — ABNORMAL HIGH (ref 70–99)
Potassium: 4.2 mmol/L (ref 3.5–5.1)
Sodium: 137 mmol/L (ref 135–145)
Total Bilirubin: 0.7 mg/dL (ref 0.3–1.2)
Total Protein: 7 g/dL (ref 6.5–8.1)

## 2018-12-13 LAB — TROPONIN I (HIGH SENSITIVITY)
Troponin I (High Sensitivity): 7 ng/L (ref <18)
Troponin I (High Sensitivity): 13 ng/L (ref <18)

## 2018-12-13 LAB — CBC
HCT: 45 % (ref 36.0–46.0)
Hemoglobin: 14.6 g/dL (ref 12.0–15.0)
MCH: 29 pg (ref 26.0–34.0)
MCHC: 32.4 g/dL (ref 30.0–36.0)
MCV: 89.5 fL (ref 80.0–100.0)
Platelets: 185 10*3/uL (ref 150–400)
RBC: 5.03 MIL/uL (ref 3.87–5.11)
RDW: 13.4 % (ref 11.5–15.5)
WBC: 8.3 10*3/uL (ref 4.0–10.5)
nRBC: 0 % (ref 0.0–0.2)

## 2018-12-13 LAB — SARS CORONAVIRUS 2 (TAT 6-24 HRS): SARS Coronavirus 2: NEGATIVE

## 2018-12-13 MED ORDER — ENOXAPARIN SODIUM 40 MG/0.4ML ~~LOC~~ SOLN
40.0000 mg | SUBCUTANEOUS | Status: DC
  Administered 2018-12-13: 40 mg via SUBCUTANEOUS
  Filled 2018-12-13 (×2): qty 0.4

## 2018-12-13 MED ORDER — SENNOSIDES-DOCUSATE SODIUM 8.6-50 MG PO TABS: 1.0000 | ORAL_TABLET | Freq: Every evening | ORAL | Status: DC | PRN

## 2018-12-13 MED ORDER — ACETAMINOPHEN 650 MG RE SUPP: 650.0000 mg | Freq: Four times a day (QID) | RECTAL | Status: DC | PRN

## 2018-12-13 MED ORDER — ACETAMINOPHEN 325 MG PO TABS: 650.0000 mg | ORAL_TABLET | Freq: Four times a day (QID) | ORAL | Status: DC | PRN

## 2018-12-13 NOTE — Plan of Care (Signed)

## 2018-12-13 NOTE — ED Notes (Signed)
Patient transported to X-ray 

## 2018-12-13 NOTE — ED Triage Notes (Signed)
Pt to ER for evaluation of left chest, shoulder, and arm aching onset 2 hours ago with nausea. She is a/o x4. She denies weakness. Denies shortness of breath. NAD at this time.

## 2018-12-13 NOTE — H&P (Signed)
Date: 12/13/2018               Patient Name:  Janice Allen Rightmyer MRN: [id number]  DOB: 1940-06-18 Age / Sex: 78 y.o., female   PCP: Si GaulGriffin, Jenny M, DO         Medical Service: Internal Medicine Teaching Service         Attending Physician: Dr. Oswaldo DoneVincent, Marquita Palmsuncan Thomas, *    First Contact: Dr. Barbaraann FasterSteen Pager: [phone]  Second Contact: Dr. Gwyneth RevelsKrienke Pager: [phone]       After Hours (After 5p/  First Contact Pager: [phone]  weekends / holidays): Second Contact Pager: [phone]   Chief Complaint: Chest pain  History of Present Illness: This is a 78 year old female with a history of OA, ?anxiety, ?depression, who presented with atypical chest pain. Patient reports that she was having nausea around 8:30 this morning, she then started having right arm pain and some chest discomfort. She reported that she had just gotten out of the shower when the chest pain started.  She also reported bilateral arm pain and tingling.  Chest pain was associated with burping.  Denied any associated shortness of breath or nausea.  She reported that she never had this before. She reports that she has been feeling better, she did not get any treatments.  Patient does not take any medications at home.  She had MRI last night to check her lower back area and neck pain to look for stenosis.    Meds:  Supplements: vitamin D3, selenium, zinc   Current Meds  Medication Sig  . Cholecalciferol (VITAMIN D3) 2000 units TABS Take 2,000 Units by mouth daily.  Marland Kitchen. ibuprofen (ADVIL) 200 MG tablet Take 200 mg by mouth every 6 (six) hours as needed for headache or mild pain.  . NON FORMULARY Place 4 drops under the tongue 3 (three) times daily as needed (for pain/sleep (scheduled in the evening)). CBD OIL  . OVER THE COUNTER MEDICATION Take 1 capsule by mouth daily. Milk thistle (Silybum marianum) extract (225 mg) Bacopa (Bacopa monnieri) extract (150 mg) Ashwagandha (Withania somnifera) root (150mg )  . Polyethyl Glycol-Propyl  Glycol (LUBRICANT EYE DROPS) 0.4-0.3 % SOLN Place 1 drop into both eyes 3 (three) times daily as needed (for dry eyes.).  Marland Kitchen. selenium 50 MCG TABS tablet Take 50 mcg by mouth daily.  Marland Kitchen. zinc gluconate 50 MG tablet Take 50 mg by mouth daily.     Allergies: Allergies as of 12/13/2018  . (No Known Allergies)   Past Medical History:  Diagnosis Date  . Anemia    hx of approx 40 years ago after prior surgery  . Anxiety   . Arthritis   . Bronchitis    approx 2 years ago  . Depression   . GERD (gastroesophageal reflux disease)   . Heart palpitations 2012   Palpitations after d/c from prior surgery. Pt thinks it was related to anxiety . denies any at this time  . History of total knee replacement    left total knee replacement  . Pneumonia    hx of    Family History: Mother had CHF at 590, grandmother had cardiac issues at 689. Uncle had stomach cancer, other uncle had ENT cancer, and other uncle had stroke.   Social History: Patient lives alone at home, has neighbors that she talks to but no family in the area. Denies any EtOH use, smoking use, or drug use. She uses herbal supplements, uses CBD to help her  sleep, uses zinc, vitamin D, and calcium.   Review of Systems: A complete ROS was negative except as per HPI.   Physical Exam: Blood pressure (!) 107/95, pulse 71, temperature 97.7 F (36.5 C), temperature source Oral, resp. rate 18, SpO2 93 %. Physical Exam  Constitutional: She is oriented to person, place, and time and well-developed, well-nourished, and in no distress.  HENT:  Head: Normocephalic and atraumatic.  Eyes: Pupils are equal, round, and reactive to light. Conjunctivae and EOM are normal.  Neck: Normal range of motion. Neck supple.  Cardiovascular: Normal rate, regular rhythm and normal heart sounds.  Non-tender to palpation over chest area  Pulmonary/Chest: Effort normal and breath sounds normal. No respiratory distress.  Abdominal: Soft. Bowel sounds are normal. She  exhibits no distension.  Musculoskeletal: Normal range of motion.        General: No edema.  Neurological: She is alert and oriented to person, place, and time.  Skin: Skin is warm and dry.  Psychiatric: Mood and affect normal.     EKG: personally reviewed my interpretation is normal sinus rhythm, no ST changes, no T wave inversions  CXR: personally reviewed my interpretation is no acute pulmonary disease  Assessment & Plan by Problem: Active Problems:   Chest pain, atypical  Atypical chest pain: Not exertional or relieved by rest, substernal chest pain. Troponin 7 > 13.  EKG is nonischemic and patient is chest pain-free on her evaluation.  No tenderness to palpation on exam.  Patient does have a history of GERD however she denies any issues or symptoms from this. She has reported some difficulty with swallowing for awhile and symptoms were associated with belching. Appears that this could be more related to an esophageal issue given her history. She also endorses some anxiety which could also be contributing to her symptoms. Will continue to monitor on telemetry.   -Continue telemetry -If recurrence of chest pain will start ASA, repeat EKG, and obtain troponin -If symptoms do not recur, would recommend further evaluation of esophageal issues and anxiety symptoms   Dispo: Admit patient to Observation with expected length of stay less than 2 midnights.  Signed: Tamsen Snider, MD PGY1  [phone]

## 2018-12-13 NOTE — ED Notes (Signed)
Did another ekg on patient shown to Dr Vanita Panda patient is resting with call bell in reach

## 2018-12-13 NOTE — ED Notes (Signed)
ED TO INPATIENT HANDOFF REPORT  ED Nurse Name and Phone #: Jeannett SeniorStephen [phone]  [id number]  S Name/Age/Gender Janice Allen 78 y.o. female Room/Bed: 027C/027C  Code Status   Code Status: Full Code  Home/SNF/Other Home Patient oriented to: self, place, time and situation Is this baseline? Yes   Triage Complete: Triage complete  Chief Complaint shaky  Triage Note Pt to ER for evaluation of left chest, shoulder, and arm aching onset 2 hours ago with nausea. She is a/o x4. She denies weakness. Denies shortness of breath. NAD at this time.    Allergies No Known Allergies  Level of Care/Admitting Diagnosis ED Disposition    ED Disposition Condition Comment   Admit  [facility] [[id number]]  Level of Care: Telemetry Medical [104]  Covid Evaluation: Asymptomatic Screening Protocol (No Symptoms)  Diagnosis: Chest pain, atypical [403474][[id number]]  Admitting Physician: Tyson AliasVINCENT, DUNCAN THOMAS [[id number]]  Attending Physician: Tyson AliasVINCENT, DUNCAN THOMAS [id number]]  PT Class (Do Not Modify): Observation [104]  PT Acc Code (Do Not Modify): Observation [10022]       B Medical/Surgery History Past Medical History:  Diagnosis Date  . Anemia    hx of approx 40 years ago after prior surgery  . Anxiety   . Arthritis   . Bronchitis    approx 2 years ago  . Depression   . GERD (gastroesophageal reflux disease)   . Heart palpitations 2012   Palpitations after d/c from prior surgery. Pt thinks it was related to anxiety . denies any at this time  . History of total knee replacement    left total knee replacement  . Pneumonia    hx of   Past Surgical History:  Procedure Laterality Date  . INCONTINENCE SURGERY  2001  . KNEE ARTHROPLASTY  06/08/2011   Procedure: COMPUTER ASSISTED TOTAL KNEE ARTHROPLASTY;  Surgeon: Harvie JuniorJohn L Graves, MD;  Location: [facility] OR;  Service: Orthopedics;;  . REPLACEMENT TOTAL KNEE  2012   left knee  . TOTAL SHOULDER ARTHROPLASTY Left 11/21/2017   Procedure: LEFT  TOTAL SHOULDER ARTHROPLASTY;  Surgeon: Jones Broomhandler, Justin, MD;  Location: [facility] OR;  Service: Orthopedics;  Laterality: Left;  . TUBAL LIGATION  1980     A IV Location/Drains/Wounds Patient Lines/Drains/Airways Status   Active Line/Drains/Airways    Name:   Placement date:   Placement time:   Site:   Days:   Peripheral IV 11/21/17 Right Hand   11/21/17    0751    Hand   387   Peripheral IV 12/13/18 Right Antecubital   12/13/18    1207    Antecubital   less than 1   Incision 06/08/11 Leg Right   06/08/11    0800     2745   Incision (Closed) 11/21/17 Shoulder Left   11/21/17    1052     387          Intake/Output Last 24 hours No intake or output data in the 24 hours ending 12/13/18 1656  Labs/Imaging Results for orders placed or performed during the hospital encounter of 12/13/18 (from the past 48 hour(s))  Comprehensive metabolic panel     Status: Abnormal   Collection Time: 12/13/18 12:05 PM  Result Value Ref Range   Sodium 137 135 - 145 mmol/L   Potassium 4.2 3.5 - 5.1 mmol/L   Chloride 102 98 - 111 mmol/L   CO2 24 22 - 32 mmol/L   Glucose, Bld 112 (H) 70 - 99 mg/dL   BUN 23  8 - 23 mg/dL   Creatinine, Ser 2.68 0.44 - 1.00 mg/dL   Calcium 34.1 8.9 - 96.2 mg/dL   Total Protein 7.0 6.5 - 8.1 g/dL   Albumin 4.2 3.5 - 5.0 g/dL   AST 19 15 - 41 U/L   ALT 15 0 - 44 U/L   Alkaline Phosphatase 70 38 - 126 U/L   Total Bilirubin 0.7 0.3 - 1.2 mg/dL   GFR calc non Af Amer >60 >60 mL/min   GFR calc Af Amer >60 >60 mL/min   Anion gap 11 5 - 15    Comment: Performed at [facility], [address], [place], [place] [address]  CBC     Status: None   Collection Time: 12/13/18 12:05 PM  Result Value Ref Range   WBC 8.3 4.0 - 10.5 K/uL   RBC 5.03 3.87 - 5.11 MIL/uL   Hemoglobin 14.6 12.0 - 15.0 g/dL   HCT 89.2 11.9 - 41.7 %   MCV 89.5 80.0 - 100.0 fL   MCH 29.0 26.0 - 34.0 pg   MCHC 32.4 30.0 - 36.0 g/dL   RDW 40.8 14.4 - 81.8 %   Platelets 185 150 - 400 K/uL   nRBC 0.0 0.0 -  0.2 %    Comment: Performed at [facility], [address], [place], [place] [address]  Troponin I (High Sensitivity)     Status: None   Collection Time: 12/13/18 12:05 PM  Result Value Ref Range   Troponin I (High Sensitivity) 7 <18 ng/L    Comment: (NOTE) Elevated high sensitivity troponin I (hsTnI) values and significant  changes across serial measurements may suggest ACS but many other  chronic and acute conditions are known to elevate hsTnI results.  Refer to the "Links" section for chest pain algorithms and additional  guidance. Performed at [facility], [address] [place], [place] [address]   Troponin I (High Sensitivity)     Status: None   Collection Time: 12/13/18  1:12 PM  Result Value Ref Range   Troponin I (High Sensitivity) 13 <18 ng/L    Comment: (NOTE) Elevated high sensitivity troponin I (hsTnI) values and significant  changes across serial measurements may suggest ACS but many other  chronic and acute conditions are known to elevate hsTnI results.  Refer to the "Links" section for chest pain algorithms and additional  guidance. Performed at [facility], [address], [place], [place] [address]    Dg Chest 2 View  Result Date: 12/13/2018 CLINICAL DATA:  Pt to ER for evaluation of left chest, shoulder, and arm aching onset 2 hours ago with nausea. She is a/o x4. She denies weakness. Denies shortness of breath. NAD at this time. PR triage RN 9/5 EXAM: CHEST - 2 VIEW COMPARISON:  Chest radiograph 11/12/2017, 05/31/2011 FINDINGS: Stable cardiomediastinal contours. No new focal infiltrate. No pneumothorax or pleural effusion. Visualized upper abdomen is unremarkable. Status post left shoulder arthroplasty. No acute osseous abnormality. IMPRESSION: No active cardiopulmonary disease. Electronically Signed   By: Emmaline Kluver M.D.   On: 12/13/2018 11:54   Mr Lumbar Spine Wo Contrast  Result Date: 12/13/2018 CLINICAL DATA:  Low back pain with  bilateral leg weakness EXAM: MRI LUMBAR SPINE WITHOUT CONTRAST TECHNIQUE: Multiplanar, multisequence MR imaging of the lumbar spine was performed. No intravenous contrast was administered. COMPARISON:  X-ray 03/08/2016 FINDINGS: Segmentation:  Standard. Alignment: 8 mm anterolisthesis L4 on L5. Minimal retrolisthesis T12 on L1 and L1  on L2. Vertebrae: No fracture, evidence of discitis, or bone lesion. Prominent discogenic marrow changes at T12-L1. Conus medullaris and cauda equina: Conus extends to the L1-L2 level. Conus and cauda equina appear normal. Paraspinal and other soft tissues: Cortically based T2 hyperintense lesionswithin the bilateral kidneys, incompletely characterized, but most likely represent cysts. Disc levels: T12-L1: Small right paracentral disc protrusion. No foraminal or canal stenosis. L1-L2: Mild diffuse disc bulge and mild bilateral facet arthrosis. No foraminal or canal stenosis. L2-L3: Mild diffuse disc bulge, mild bilateral facet arthrosis, and ligamentum flavum buckling with mild left foraminal stenosis. No canal stenosis. L3-L4: Mild bilateral facet arthrosis and ligamentum flavum buckling. No significant disc protrusion, foraminal stenosis, or canal stenosis. L4-L5: Disc uncovering with central disc protrusion. Advanced bilateral facet arthrosis. Bilateral subarticular recess narrowing. Mild right foraminal stenosis. No left foraminal or canal stenosis. L5-S1: Diffuse disc bulge with advanced right greater than left facet arthrosis resulting in moderate right foraminal stenosis. No canal stenosis. Incidental note of sacral perineural cysts. IMPRESSION: 1. Multilevel lumbar spondylosis including grade 1 anterolisthesis of L4 on L5. There is bilateral subarticular recess narrowing and mild right foraminal stenosis at the L4-5 level. 2. Moderate right foraminal stenosis at L5-S1. Electronically Signed   By: Davina Poke M.D.   On: 12/13/2018 16:13    Pending Labs Unresulted Labs  (From admission, onward)    Start     Ordered   12/14/18 1610  Basic metabolic panel  Tomorrow morning,   R     12/13/18 1644   12/14/18 0500  CBC  Tomorrow morning,   R     12/13/18 1644   12/13/18 1529  SARS CORONAVIRUS 2 (TAT 6-24 HRS) Nasopharyngeal Nasopharyngeal Swab  (Asymptomatic/Tier 2 Patients Labs)  Once,   STAT    Question Answer Comment  Is this test for diagnosis or screening Screening   Symptomatic for COVID-19 as defined by CDC No   Hospitalized for COVID-19 No   Admitted to ICU for COVID-19 No   Previously tested for COVID-19 No   Resident in a congregate (group) care setting No   Employed in healthcare setting No   Pregnant No      12/13/18 1528          Vitals/Pain Today's Vitals   12/13/18 1400 12/13/18 1445 12/13/18 1551 12/13/18 1600  BP: 136/66 (!) 157/107 111/90 117/81  Pulse: 67 77 74 69  Resp: 13 (!) 22 15 20   Temp:      TempSrc:      SpO2: 98% 95% 95% 97%  PainSc:        Isolation Precautions No active isolations  Medications Medications  enoxaparin (LOVENOX) injection 40 mg (has no administration in time range)  acetaminophen (TYLENOL) tablet 650 mg (has no administration in time range)    Or  acetaminophen (TYLENOL) suppository 650 mg (has no administration in time range)  senna-docusate (Senokot-S) tablet 1 tablet (has no administration in time range)    Mobility walks  Low fall risk   Focused Assessments Cardiac Assessment Handoff:  Cardiac Rhythm: Normal sinus rhythm Lab Results  Component Value Date   CKTOTAL 78 10/12/2010   CKMB 2.3 10/12/2010   TROPONINI <0.30 10/12/2010   Lab Results  Component Value Date   DDIMER 2.82 (H) 10/12/2010   Does the Patient currently have chest pain? No     R Recommendations: See Admitting Provider Note  Report given to:   Additional Notes:

## 2018-12-13 NOTE — ED Provider Notes (Signed)
[facility] Provider Note   CSN: [id number] Arrival date & time: 12/13/18  1032     History   Chief Complaint Chief Complaint  Patient presents with   Weakness    HPI Sandford CrazeBillie J Staib is a 78 y.o. female.     HPI Patient presents with concern of pressure to her chest some shoulder, both forearms, and nausea. Onset was about 2 hours ago, and symptoms have improved without clear intervention since that time. No associated dyspnea, no fever, no vomiting, no diarrhea. Patient notes that she has been in the usual state of health until about the past week, when she had cataract surgery, and has had frustration with follow-up care. This includes increased frustration yesterday, and no sleep overnight, as well as eating more than usual yesterday. She denies history of cardiac disease, states that she is generally well aside from history of cataracts, bladder sling.  Past Medical History:  Diagnosis Date   Anemia    hx of approx 40 years ago after prior surgery   Anxiety    Arthritis    Bronchitis    approx 2 years ago   Depression    GERD (gastroesophageal reflux disease)    Heart palpitations 2012   Palpitations after d/c from prior surgery. Pt thinks it was related to anxiety . denies any at this time   History of total knee replacement    left total knee replacement   Pneumonia    hx of    Patient Active Problem List   Diagnosis Date Noted   S/P shoulder replacement, left 11/21/2017   Osteoarthritis of right knee 06/08/2011    Past Surgical History:  Procedure Laterality Date   INCONTINENCE SURGERY  2001   KNEE ARTHROPLASTY  06/08/2011   Procedure: COMPUTER ASSISTED TOTAL KNEE ARTHROPLASTY;  Surgeon: Harvie JuniorJohn L Graves, MD;  Location: [facility] OR;  Service: Orthopedics;;   REPLACEMENT TOTAL KNEE  2012   left knee   TOTAL SHOULDER ARTHROPLASTY Left 11/21/2017   Procedure: LEFT TOTAL SHOULDER ARTHROPLASTY;  Surgeon:  Jones Broomhandler, Justin, MD;  Location: [facility] OR;  Service: Orthopedics;  Laterality: Left;   TUBAL LIGATION  1980     OB History   No obstetric history on file.      Home Medications    Prior to Admission medications   Medication Sig Start Date End Date Taking? Authorizing Provider  Cholecalciferol (VITAMIN D3) 2000 units TABS Take 2,000 Units by mouth daily.   Yes [provider]  ibuprofen (ADVIL) 200 MG tablet Take 200 mg by mouth every 6 (six) hours as needed for headache or mild pain.   Yes [provider]  NON FORMULARY Place 4 drops under the tongue 3 (three) times daily as needed (for pain/sleep (scheduled in the evening)). CBD OIL   Yes [provider]  OVER THE COUNTER MEDICATION Take 1 capsule by mouth daily. Milk thistle (Silybum marianum) extract (225 mg) Bacopa (Bacopa monnieri) extract (150 mg) Ashwagandha (Withania somnifera) root (150mg )   Yes [provider]  Polyethyl Glycol-Propyl Glycol (LUBRICANT EYE DROPS) 0.4-0.3 % SOLN Place 1 drop into both eyes 3 (three) times daily as needed (for dry eyes.).   Yes [provider]  selenium 50 MCG TABS tablet Take 50 mcg by mouth daily.   Yes [provider]  zinc gluconate 50 MG tablet Take 50 mg by mouth daily.   Yes [provider]  HYDROcodone-acetaminophen (NORCO) 5-325 MG tablet Take 1-2 tablets by  mouth every 4 (four) hours as needed for moderate pain. Patient not taking: Reported on 12/13/2018 11/22/17   Grier Mitts, PA-C    Family History Family History  Problem Relation Age of Onset   Anesthesia problems Neg Hx    Hypotension Neg Hx    Malignant hyperthermia Neg Hx    Pseudochol deficiency Neg Hx     Social History Social History   Tobacco Use   Smoking status: Never Smoker   Smokeless tobacco: Never Used  Substance Use Topics   Alcohol use: No   Drug use: No     Allergies   Patient has no known allergies.   Review of  Systems Review of Systems  Constitutional:       Per HPI, otherwise negative  HENT:       Per HPI, otherwise negative  Respiratory:       Per HPI, otherwise negative  Cardiovascular:       Per HPI, otherwise negative  Gastrointestinal: Negative for vomiting.  Endocrine:       Negative aside from HPI  Genitourinary:       Neg aside from HPI   Musculoskeletal:       Per HPI, otherwise negative  Skin: Negative.   Allergic/Immunologic: Negative for immunocompromised state.  Neurological: Negative for syncope.  Hematological:       Reported CT findings concerning for malignancy 10 years ago, with no subsequent evidence of this.  Psychiatric/Behavioral: The patient is nervous/anxious.      Physical Exam Updated Vital Signs BP (!) 157/107    Pulse 77    Temp 98.2 F (36.8 C) (Oral)    Resp (!) 22    SpO2 95%   Physical Exam Vitals signs and nursing note reviewed.  Constitutional:      General: She is not in acute distress.    Appearance: She is well-developed.  HENT:     Head: Normocephalic and atraumatic.  Eyes:     Conjunctiva/sclera: Conjunctivae normal.  Cardiovascular:     Rate and Rhythm: Normal rate and regular rhythm.     Pulses: Normal pulses.  Pulmonary:     Effort: Pulmonary effort is normal. No respiratory distress.     Breath sounds: No stridor.  Abdominal:     General: There is no distension.  Skin:    General: Skin is warm and dry.  Neurological:     Mental Status: She is alert and oriented to person, place, and time.     Cranial Nerves: No cranial nerve deficit.  Psychiatric:        Mood and Affect: Mood is anxious.      ED Treatments / Results  Labs (all labs ordered are listed, but only abnormal results are displayed) Labs Reviewed  COMPREHENSIVE METABOLIC PANEL - Abnormal; Notable for the following components:      Result Value   Glucose, Bld 112 (*)    All other components within normal limits  CBC  TROPONIN I (HIGH SENSITIVITY)   TROPONIN I (HIGH SENSITIVITY)    EKG Sinus rhythm, rate 74, wander, nonspecific T wave changes, artifact, abnormal - poor study Radiology Dg Chest 2 View  Result Date: 12/13/2018 CLINICAL DATA:  Pt to ER for evaluation of left chest, shoulder, and arm aching onset 2 hours ago with nausea. She is a/o x4. She denies weakness. Denies shortness of breath. NAD at this time. PR triage RN 9/5 EXAM: CHEST - 2 VIEW COMPARISON:  Chest radiograph 11/12/2017, 05/31/2011 FINDINGS: Stable  cardiomediastinal contours. No new focal infiltrate. No pneumothorax or pleural effusion. Visualized upper abdomen is unremarkable. Status post left shoulder arthroplasty. No acute osseous abnormality. IMPRESSION: No active cardiopulmonary disease. Electronically Signed   By: Emmaline Kluver M.D.   On: 12/13/2018 11:54    Procedures Procedures (including critical care time)  Medications Ordered in ED Medications - No data to display   Initial Impression / Assessment and Plan / ED Course  I have reviewed the triage vital signs and the nursing notes.  Pertinent labs & imaging results that were available during my care of the patient were reviewed by me and considered in my medical decision making (see chart for details).        2:56 PM On repeat exam the patient is in similar condition, now with no ongoing discomfort. We reviewed all findings again, including reassuring x-ray, EKGs is not overtly ischemic, and labs. Labs notable for delta troponin of 6/2 hours, which given her history of new unusual chest discomfort, arm discomfort, possibly with exertion raises suspicion of cardiac etiology, warranting admission. No other early evidence for concurrent pneumonia, no hypoxia suggesting pulmonary embolism, no evidence for neurovascular disease. Patient admitted for further monitoring, management.  Final Clinical Impressions(s) / ED Diagnoses  Atypical chest pain   Gerhard Munch, MD 12/13/18 1458

## 2018-12-14 ENCOUNTER — Encounter (HOSPITAL_COMMUNITY): Payer: Self-pay | Admitting: *Deleted

## 2018-12-14 ENCOUNTER — Inpatient Hospital Stay (HOSPITAL_COMMUNITY): Payer: Medicare Other

## 2018-12-14 ENCOUNTER — Observation Stay (HOSPITAL_COMMUNITY): Payer: Medicare Other

## 2018-12-14 DIAGNOSIS — Z800 Family history of malignant neoplasm of digestive organs: Secondary | ICD-10-CM | POA: Diagnosis not present

## 2018-12-14 DIAGNOSIS — I083 Combined rheumatic disorders of mitral, aortic and tricuspid valves: Secondary | ICD-10-CM | POA: Diagnosis present

## 2018-12-14 DIAGNOSIS — R9431 Abnormal electrocardiogram [ECG] [EKG]: Secondary | ICD-10-CM | POA: Diagnosis not present

## 2018-12-14 DIAGNOSIS — M25522 Pain in left elbow: Secondary | ICD-10-CM | POA: Diagnosis not present

## 2018-12-14 DIAGNOSIS — E785 Hyperlipidemia, unspecified: Secondary | ICD-10-CM | POA: Diagnosis present

## 2018-12-14 DIAGNOSIS — K219 Gastro-esophageal reflux disease without esophagitis: Secondary | ICD-10-CM | POA: Diagnosis present

## 2018-12-14 DIAGNOSIS — I340 Nonrheumatic mitral (valve) insufficiency: Secondary | ICD-10-CM | POA: Diagnosis not present

## 2018-12-14 DIAGNOSIS — Z8249 Family history of ischemic heart disease and other diseases of the circulatory system: Secondary | ICD-10-CM | POA: Diagnosis not present

## 2018-12-14 DIAGNOSIS — R0789 Other chest pain: Secondary | ICD-10-CM

## 2018-12-14 DIAGNOSIS — R7989 Other specified abnormal findings of blood chemistry: Secondary | ICD-10-CM | POA: Diagnosis not present

## 2018-12-14 DIAGNOSIS — Z96612 Presence of left artificial shoulder joint: Secondary | ICD-10-CM | POA: Diagnosis present

## 2018-12-14 DIAGNOSIS — I200 Unstable angina: Secondary | ICD-10-CM | POA: Diagnosis not present

## 2018-12-14 DIAGNOSIS — F419 Anxiety disorder, unspecified: Secondary | ICD-10-CM | POA: Diagnosis present

## 2018-12-14 DIAGNOSIS — Z20828 Contact with and (suspected) exposure to other viral communicable diseases: Secondary | ICD-10-CM | POA: Diagnosis present

## 2018-12-14 DIAGNOSIS — R079 Chest pain, unspecified: Secondary | ICD-10-CM | POA: Diagnosis present

## 2018-12-14 DIAGNOSIS — Z823 Family history of stroke: Secondary | ICD-10-CM | POA: Diagnosis not present

## 2018-12-14 DIAGNOSIS — E7800 Pure hypercholesterolemia, unspecified: Secondary | ICD-10-CM | POA: Diagnosis not present

## 2018-12-14 DIAGNOSIS — M1990 Unspecified osteoarthritis, unspecified site: Secondary | ICD-10-CM

## 2018-12-14 DIAGNOSIS — I351 Nonrheumatic aortic (valve) insufficiency: Secondary | ICD-10-CM

## 2018-12-14 DIAGNOSIS — R778 Other specified abnormalities of plasma proteins: Secondary | ICD-10-CM | POA: Diagnosis not present

## 2018-12-14 DIAGNOSIS — Z96652 Presence of left artificial knee joint: Secondary | ICD-10-CM | POA: Diagnosis present

## 2018-12-14 DIAGNOSIS — I214 Non-ST elevation (NSTEMI) myocardial infarction: Secondary | ICD-10-CM | POA: Diagnosis not present

## 2018-12-14 LAB — LIPID PANEL
Cholesterol: 206 mg/dL — ABNORMAL HIGH (ref 0–200)
HDL: 65 mg/dL (ref >40)
LDL Cholesterol: 122 mg/dL — ABNORMAL HIGH (ref 0–99)
Total CHOL/HDL Ratio: 3.2 RATIO
Triglycerides: 96 mg/dL (ref <150)
VLDL: 19 mg/dL (ref 0–40)

## 2018-12-14 LAB — BASIC METABOLIC PANEL
Anion gap: 9 (ref 5–15)
BUN: 18 mg/dL (ref 8–23)
CO2: 25 mmol/L (ref 22–32)
Calcium: 9.3 mg/dL (ref 8.9–10.3)
Chloride: 103 mmol/L (ref 98–111)
Creatinine, Ser: 0.79 mg/dL (ref 0.44–1.00)
GFR calc Af Amer: >60 mL/min (ref >60)
GFR calc non Af Amer: >60 mL/min (ref >60)
Glucose, Bld: 104 mg/dL — ABNORMAL HIGH (ref 70–99)
Potassium: 4.1 mmol/L (ref 3.5–5.1)
Sodium: 137 mmol/L (ref 135–145)

## 2018-12-14 LAB — CBC
HCT: 44.8 % (ref 36.0–46.0)
Hemoglobin: 14.4 g/dL (ref 12.0–15.0)
MCH: 28.7 pg (ref 26.0–34.0)
MCHC: 32.1 g/dL (ref 30.0–36.0)
MCV: 89.2 fL (ref 80.0–100.0)
Platelets: 197 10*3/uL (ref 150–400)
RBC: 5.02 MIL/uL (ref 3.87–5.11)
RDW: 13.7 % (ref 11.5–15.5)
WBC: 6 10*3/uL (ref 4.0–10.5)
nRBC: 0 % (ref 0.0–0.2)

## 2018-12-14 LAB — ECHOCARDIOGRAM COMPLETE
Height: 61 in
Weight: 2579.2 oz

## 2018-12-14 LAB — HEMOGLOBIN A1C
Hgb A1c MFr Bld: 5.9 % — ABNORMAL HIGH (ref 4.8–5.6)
Mean Plasma Glucose: 122.63 mg/dL

## 2018-12-14 LAB — TSH: TSH: 1.573 u[IU]/mL (ref 0.350–4.500)

## 2018-12-14 LAB — HEPARIN LEVEL (UNFRACTIONATED): Heparin Unfractionated: 0.53 IU/mL (ref 0.30–0.70)

## 2018-12-14 LAB — TROPONIN I (HIGH SENSITIVITY): Troponin I (High Sensitivity): 268 ng/L (ref <18)

## 2018-12-14 MED ORDER — HEPARIN (PORCINE) 25000 UT/250ML-% IV SOLN
750.0000 [IU]/h | INTRAVENOUS | Status: DC
  Administered 2018-12-14 – 2018-12-15 (×2): 750 [IU]/h via INTRAVENOUS
  Filled 2018-12-14 (×2): qty 250

## 2018-12-14 MED ORDER — ASPIRIN 81 MG PO CHEW
81.0000 mg | CHEWABLE_TABLET | Freq: Every day | ORAL | Status: DC
  Administered 2018-12-15 – 2018-12-16 (×2): 81 mg via ORAL
  Filled 2018-12-14 (×2): qty 1

## 2018-12-14 MED ORDER — ALPRAZOLAM 0.25 MG PO TABS
0.2500 mg | ORAL_TABLET | Freq: Two times a day (BID) | ORAL | Status: DC | PRN
  Administered 2018-12-14 – 2018-12-15 (×3): 0.25 mg via ORAL
  Filled 2018-12-14 (×3): qty 1

## 2018-12-14 MED ORDER — ATORVASTATIN CALCIUM 40 MG PO TABS
40.0000 mg | ORAL_TABLET | Freq: Every day | ORAL | Status: DC
  Administered 2018-12-14 – 2018-12-15 (×2): 40 mg via ORAL
  Filled 2018-12-14 (×2): qty 1

## 2018-12-14 MED ORDER — ASPIRIN 325 MG PO TABS
325.0000 mg | ORAL_TABLET | Freq: Every day | ORAL | Status: DC
  Administered 2018-12-14: 325 mg via ORAL
  Filled 2018-12-14: qty 1

## 2018-12-14 MED ORDER — HEPARIN BOLUS VIA INFUSION
3800.0000 [IU] | Freq: Once | INTRAVENOUS | Status: AC
  Administered 2018-12-14: 3800 [IU] via INTRAVENOUS
  Filled 2018-12-14: qty 3800

## 2018-12-14 MED ORDER — IOHEXOL 350 MG/ML SOLN
75.0000 mL | Freq: Once | INTRAVENOUS | Status: AC | PRN
  Administered 2018-12-14: 75 mL via INTRAVENOUS

## 2018-12-14 NOTE — Progress Notes (Signed)
*  PRELIMINARY RESULTS* Echocardiogram 2D Echocardiogram has been performed.  Janice Allen 12/14/2018, 3:14 PM

## 2018-12-14 NOTE — Progress Notes (Signed)
ANTICOAGULATION CONSULT NOTE   Pharmacy Consult for heparin  Indication: chest pain/ACS  No Known Allergies  Patient Measurements:  Heparin Dosing Weight: 63.8  Vital Signs: Temp: 98.2 F (36.8 C) (09/06 1335) Temp Source: Oral (09/06 1335) BP: 117/70 (09/06 1335) Pulse Rate: 67 (09/06 1335)  Labs: Recent Labs    12/13/18 1205 12/13/18 1312 12/14/18 0619 12/14/18 1020  HGB 14.6  --  14.4  --   HCT 45.0  --  44.8  --   PLT 185  --  197  --   CREATININE 0.82  --  0.79  --   TROPONINIHS 7 13  --  268*    CrCl cannot be calculated (Unknown ideal weight.).   Medical History: Past Medical History:  Diagnosis Date  . Anemia    hx of approx 40 years ago after prior surgery  . Anxiety   . Arthritis   . Bronchitis    approx 2 years ago  . Depression   . GERD (gastroesophageal reflux disease)   . Heart palpitations 2012   Palpitations after d/c from prior surgery. Pt thinks it was related to anxiety . denies any at this time    Medications:  Scheduled:  . [START ON 12/15/2018] aspirin  81 mg Oral Daily  . aspirin  325 mg Oral Daily  . atorvastatin  40 mg Oral q1800      Assessment: Pt is a 39 yof who presented with pain consistent of unstable angina and has elevated troponins. Cath planned, tentatively for Tuesday.  No anticoagulation PTA. H&H stable at 14.4/44.8, plts wnl.   Goal of Therapy:  Heparin level 0.3-0.7 units/ml Monitor platelets by anticoagulation protocol: Yes   Plan:  Give 3800 units bolus x 1 Start heparin infusion at 750 units/hr Check anti-Xa level in 8 hours and daily while on heparin Continue to monitor H&H and platelets  Thank you,   Eddie Candle, PharmD PGY-1 Pharmacy Resident   Please check amion for clinical pharmacist contact number 12/14/2018,1:57 PM

## 2018-12-14 NOTE — Progress Notes (Signed)
CRITICAL VALUE ALERT  Critical Value:  Trop. 268  Date & Time Notied:  12/14/2018, 11:30  Provider Notified: Benjamine Mola  Orders Received/Actions taken: Orders placed for labs, EKG, and mediations. I will continue to monitor the patient closely.  Saddie Benders RN BSN

## 2018-12-14 NOTE — Consult Note (Addendum)
Cardiology Consultation:   Patient ID: Janice CrazeBillie J Mittelstaedt; [id number]; Mar 11, 1941   Admit date: 12/13/2018 Date of Consult: 12/14/2018  Primary Care Provider: Si GaulGriffin, Jenny M, DO Primary Cardiologist: No primary care provider on file. New.  Dr. Antoine PocheHochrein Primary Electrophysiologist:  NA   Patient Profile:   Janice Allen is a 78 y.o. female without cardiac history who is being seen today for the evaluation of arm pain and an elevated troponin at the request of Dr. Barbaraann FasterSteen.  History of Present Illness:   Ms. Andria MeuseStevens has no prior cardiac history.  Yesterday in the morning when she was is moving around her house she noticed a new discomfort in the inside of both of her elbows.  This was an aching discomfort that she not really had before.  She described it is only 1 out of 10 but it clearly caught her attention.  She had some burping and was very nauseated.  This was unusual for her.  She took some Tums.  It lasted for a few hours so she drove to the emergency room.  She did not have chest pressure, neck or arm discomfort.  She did not have shortness of breath, palpitations, presyncope or syncope.  She did not have acute EKG changes when she presented to the emergency room.  Initial hs Trop was 7 rising to 13 but overnight went to 268.  On EKG today she does have some very subtle anterior T wave inversions.  She has not had any further discomfort since it went away spontaneously and her nausea has resolved all of the symptoms lasting about 2 hours.  She had a history of palpitations when she got during a panic attack but she otherwise has had no cardiac complaints.  She denies chest pressure, neck or arm discomfort typically.  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  She lives alone.   Past Medical History:  Diagnosis Date  . Anemia    hx of approx 40 years ago after prior surgery  . Anxiety   . Arthritis   . Bronchitis    approx 2 years ago  . Depression   . GERD  (gastroesophageal reflux disease)   . Heart palpitations 2012   Palpitations after d/c from prior surgery. Pt thinks it was related to anxiety . denies any at this time    Past Surgical History:  Procedure Laterality Date  . INCONTINENCE SURGERY  2001  . KNEE ARTHROPLASTY  06/08/2011   Procedure: COMPUTER ASSISTED TOTAL KNEE ARTHROPLASTY;  Surgeon: Harvie JuniorJohn L Graves, MD;  Location: [facility] OR;  Service: Orthopedics;;  . REPLACEMENT TOTAL KNEE  2012   left knee  . TOTAL SHOULDER ARTHROPLASTY Left 11/21/2017   Procedure: LEFT TOTAL SHOULDER ARTHROPLASTY;  Surgeon: Jones Broomhandler, Justin, MD;  Location: [facility] OR;  Service: Orthopedics;  Laterality: Left;  . TUBAL LIGATION  1980     Prior to Admission medications   Medication Sig Start Date End Date Taking? Authorizing Provider  Cholecalciferol (VITAMIN D3) 2000 units TABS Take 2,000 Units by mouth daily.   Yes [provider]  ibuprofen (ADVIL) 200 MG tablet Take 200 mg by mouth every 6 (six) hours as needed for headache or mild pain.   Yes [provider]  NON FORMULARY Place 4 drops under the tongue 3 (three) times daily as needed (for pain/sleep (scheduled in the evening)). CBD OIL   Yes [provider]  OVER THE COUNTER MEDICATION Take 1 capsule by mouth daily. Milk  thistle (Silybum marianum) extract (225 mg) Bacopa (Bacopa monnieri) extract (150 mg) Ashwagandha (Withania somnifera) root (150mg )   Yes [provider]  Polyethyl Glycol-Propyl Glycol (LUBRICANT EYE DROPS) 0.4-0.3 % SOLN Place 1 drop into both eyes 3 (three) times daily as needed (for dry eyes.).   Yes [provider]  selenium 50 MCG TABS tablet Take 50 mcg by mouth daily.   Yes [provider]  zinc gluconate 50 MG tablet Take 50 mg by mouth daily.   Yes [provider]  HYDROcodone-acetaminophen (NORCO) 5-325 MG tablet Take 1-2 tablets by mouth every 4 (four) hours as needed for moderate pain. Patient not taking: Reported on  12/13/2018 11/22/17   Jiles HaroldLaliberte, Danielle, PA-C     Inpatient Medications: Scheduled Meds: . [START ON 12/15/2018] aspirin  81 mg Oral Daily  . aspirin  325 mg Oral Daily  . enoxaparin (LOVENOX) injection  40 mg Subcutaneous Q24H   Continuous Infusions:  PRN Meds: acetaminophen **OR** acetaminophen, senna-docusate  Allergies:   No Known Allergies  Social History:   Social History   Socioeconomic History  . Marital status: Divorced    Spouse name: Not on file  . Number of children: Not on file  . Years of education: Not on file  . Highest education level: Not on file  Occupational History  . Not on file  Social Needs  . Financial resource strain: Not on file  . Food insecurity    Worry: Not on file    Inability: Not on file  . Transportation needs    Medical: Not on file    Non-medical: Not on file  Tobacco Use  . Smoking status: Never Smoker  . Smokeless tobacco: Never Used  Substance and Sexual Activity  . Alcohol use: No  . Drug use: No  . Sexual activity: Not on file  Lifestyle  . Physical activity    Days per week: Not on file    Minutes per session: Not on file  . Stress: Not on file  Relationships  . Social Musicianconnections    Talks on phone: Not on file    Gets together: Not on file    Attends religious service: Not on file    Active member of club or organization: Not on file    Attends meetings of clubs or organizations: Not on file    Relationship status: Not on file  . Intimate partner violence    Fear of current or ex partner: Not on file    Emotionally abused: Not on file    Physically abused: Not on file    Forced sexual activity: Not on file  Other Topics Concern  . Not on file  Social History Narrative  . Not on file    Family History:    Family History  Problem Relation Age of Onset  . Anesthesia problems Neg Hx   . Hypotension Neg Hx   . Malignant hyperthermia Neg Hx   . Pseudochol deficiency Neg Hx      ROS:  Please see the history  of present illness.  ROS  As stated in the HPI and negative for all other systems.  Physical Exam/Data:   Vitals:   12/13/18 1600 12/13/18 1645 12/13/18 1730 12/13/18 2154  BP: 117/81 124/75 (!) 130/92 (!) 107/95  Pulse: 69 64 67 71  Resp: 20 13 20 18   Temp:    97.7 F (36.5 C)  TempSrc:    Oral  SpO2: 97% 96% 96% 93%  Intake/Output Summary (Last 24 hours) at 12/14/2018 1222 Last data filed at 12/14/2018 0700 Gross per 24 hour  Intake 480 ml  Output -  Net 480 ml   There were no vitals filed for this visit. There is no height or weight on file to calculate BMI.  GENERAL:  Well appearing HEENT:   Pupils equal round and reactive, fundi not visualized, oral mucosa unremarkable NECK:  No  jugular venous distention, waveform within normal limits, carotid upstroke brisk and symmetric, no bruits, no thyromegaly LYMPHATICS:  No cervical, inguinal adenopathy LUNGS:   Clear to auscultation bilaterally BACK:  No CVA tenderness CHEST:   Unremarkable HEART:  PMI not displaced or sustained,S1 and S2 within normal limits, no S3, no S4, no clicks, no rubs, no murmurs ABD:  Flat, positive bowel sounds normal in frequency in pitch, no bruits, no rebound, no guarding, no midline pulsatile mass, no hepatomegaly, no splenomegaly EXT:  2 plus pulses throughout, no edema, no cyanosis no clubbing SKIN:  No rashes no nodules NEURO:   Cranial nerves II through XII grossly intact, motor grossly intact throughout PSYCH:    Cognitively intact, oriented to person place and time   EKG:  The EKG was personally reviewed and demonstrates:  NSR, rate 63, questionable LAE, no acute ST T wave changes.   Telemetry:  Telemetry was personally reviewed and demonstrates:  NSR  Relevant CV Studies: NA  Laboratory Data:  Chemistry Recent Labs  Lab 12/13/18 1205 12/14/18 0619  NA 137 137  K 4.2 4.1  CL 102 103  CO2 24 25  GLUCOSE 112* 104*  BUN 23 18  CREATININE 0.82 0.79  CALCIUM 10.0 9.3  GFRNONAA  >60 >60  GFRAA >60 >60  ANIONGAP 11 9    Recent Labs  Lab 12/13/18 1205  PROT 7.0  ALBUMIN 4.2  AST 19  ALT 15  ALKPHOS 70  BILITOT 0.7   Hematology Recent Labs  Lab 12/13/18 1205 12/14/18 0619  WBC 8.3 6.0  RBC 5.03 5.02  HGB 14.6 14.4  HCT 45.0 44.8  MCV 89.5 89.2  MCH 29.0 28.7  MCHC 32.4 32.1  RDW 13.4 13.7  PLT 185 197   Cardiac EnzymesNo results for input(s): TROPONINI in the last 168 hours. No results for input(s): TROPIPOC in the last 168 hours.  BNPNo results for input(s): BNP, PROBNP in the last 168 hours.  DDimer No results for input(s): DDIMER in the last 168 hours.  Radiology/Studies:  Dg Chest 2 View  Result Date: 12/13/2018 CLINICAL DATA:  Pt to ER for evaluation of left chest, shoulder, and arm aching onset 2 hours ago with nausea. She is a/o x4. She denies weakness. Denies shortness of breath. NAD at this time. PR triage RN 9/5 EXAM: CHEST - 2 VIEW COMPARISON:  Chest radiograph 11/12/2017, 05/31/2011 FINDINGS: Stable cardiomediastinal contours. No new focal infiltrate. No pneumothorax or pleural effusion. Visualized upper abdomen is unremarkable. Status post left shoulder arthroplasty. No acute osseous abnormality. IMPRESSION: No active cardiopulmonary disease. Electronically Signed   By: Audie Pinto M.D.   On: 12/13/2018 11:54   Mr Lumbar Spine Wo Contrast  Result Date: 12/13/2018 CLINICAL DATA:  Low back pain with bilateral leg weakness EXAM: MRI LUMBAR SPINE WITHOUT CONTRAST TECHNIQUE: Multiplanar, multisequence MR imaging of the lumbar spine was performed. No intravenous contrast was administered. COMPARISON:  X-ray 03/08/2016 FINDINGS: Segmentation:  Standard. Alignment: 8 mm anterolisthesis L4 on L5. Minimal retrolisthesis T12 on L1 and L1 on L2. Vertebrae: No fracture, evidence  of discitis, or bone lesion. Prominent discogenic marrow changes at T12-L1. Conus medullaris and cauda equina: Conus extends to the L1-L2 level. Conus and cauda equina appear  normal. Paraspinal and other soft tissues: Cortically based T2 hyperintense lesionswithin the bilateral kidneys, incompletely characterized, but most likely represent cysts. Disc levels: T12-L1: Small right paracentral disc protrusion. No foraminal or canal stenosis. L1-L2: Mild diffuse disc bulge and mild bilateral facet arthrosis. No foraminal or canal stenosis. L2-L3: Mild diffuse disc bulge, mild bilateral facet arthrosis, and ligamentum flavum buckling with mild left foraminal stenosis. No canal stenosis. L3-L4: Mild bilateral facet arthrosis and ligamentum flavum buckling. No significant disc protrusion, foraminal stenosis, or canal stenosis. L4-L5: Disc uncovering with central disc protrusion. Advanced bilateral facet arthrosis. Bilateral subarticular recess narrowing. Mild right foraminal stenosis. No left foraminal or canal stenosis. L5-S1: Diffuse disc bulge with advanced right greater than left facet arthrosis resulting in moderate right foraminal stenosis. No canal stenosis. Incidental note of sacral perineural cysts. IMPRESSION: 1. Multilevel lumbar spondylosis including grade 1 anterolisthesis of L4 on L5. There is bilateral subarticular recess narrowing and mild right foraminal stenosis at the L4-5 level. 2. Moderate right foraminal stenosis at L5-S1. Electronically Signed   By: Duanne Guess M.D.   On: 12/13/2018 16:13    Assessment and Plan:   ARM PAIN:    The patient has pain consistent with unstable angina particularly in the light of the rising troponin.  This has to be considered obstructive coronary artery disease.  She will be treated with aspirin.  I will switch her to heparin.  She needs a cardiac catheterization.  This would be on Tuesday. The patient understands that risks included but are not limited to stroke (1 in 1000), death (1 in 1000), kidney failure [usually temporary] (1 in 500), bleeding (1 in 200), allergic reaction [possibly serious] (1 in 200).  The patient understands  and agrees to proceed.  I will hold off on beta-blocker as patient is very reluctant to take medications and her heart rate and blood pressure are borderline.  RISK REDUCTION:  LDL is 122. I will start Lipitor.   For questions or updates, please contact [facility] Please consult [email or web address] for contact info under Cardiology/STEMI.   Signed, Rollene Rotunda, MD  12/14/2018 12:22 PM

## 2018-12-14 NOTE — Progress Notes (Signed)
Internal Medicine Teaching Service Attending:   I saw and examined the patient. I reviewed the resident's note and I agree with the resident's findings and plan as documented in the resident's note.  Principal Problem:   Unstable angina (HCC) Active Problems:   Elevated troponin   Hyperlipidemia  Hospital day #1 for this 78 year old woman with hyperlipidemia admitted for a heavy sensation in her elbows and found to have a very elevated troponin and mild ECG changes the next morning. Greatly appreciate cardiology consultation. This morning she has no sensation of chest pain, pressure, or arm sensations. Plan is for anticoagulation with heparin in addition to aspirin and atorvastatin. LHC planned for Tuesday.   Lalla Brothers, MD FACP

## 2018-12-14 NOTE — Progress Notes (Signed)
ANTICOAGULATION CONSULT NOTE - Follow Up Consult  Pharmacy Consult for heparin Indication: chest pain/ACS  Labs: Recent Labs    12/13/18 1205 12/13/18 1312 12/14/18 0619 12/14/18 1020 12/14/18 2209  HGB 14.6  --  14.4  --   --   HCT 45.0  --  44.8  --   --   PLT 185  --  197  --   --   HEPARINUNFRC  --   --   --   --  0.53  CREATININE 0.82  --  0.79  --   --   TROPONINIHS 7 13  --  268*  --      Assessment/Plan:  78yo female therapeutic on heparin with initial dosing for CP. Will continue gtt at current rate and confirm stable with am labs.   Wynona Neat, PharmD, BCPS  12/14/2018,11:23 PM

## 2018-12-14 NOTE — Progress Notes (Signed)
   Subjective: Patient reports feeling well this morning.  No chest pain or shortness of breath.  Has chronic osteoarthritis but no other pain this morning.  Says she has felt fine since yesterday afternoon.  Repeat troponin elevated.  Patient more anxious after hearing this news.  Objective:  Vital signs in last 24 hours: Vitals:   12/13/18 1600 12/13/18 1645 12/13/18 1730 12/13/18 2154  BP: 117/81 124/75 (!) 130/92 (!) 107/95  Pulse: 69 64 67 71  Resp: 20 13 20 18   Temp:    97.7 F (36.5 C)  TempSrc:    Oral  SpO2: 97% 96% 96% 93%    Physical Exam Constitutional:      Appearance: Normal appearance. She is not diaphoretic.  Cardiovascular:     Rate and Rhythm: Normal rate and regular rhythm.     Heart sounds: No murmur. No friction rub. No gallop.   Pulmonary:     Breath sounds: Normal breath sounds. No wheezing, rhonchi or rales.  Neurological:     Mental Status: She is alert.     Assessment/Plan:  Active Problems:   Chest pain, atypical  78 year old woman who presented with atypical chest pain.  High-sensitivity troponin 7> 13.  Patient asymptomatic on exam this morning however repeat troponin elevated to 268.  ST depressions in V3 through V5. TTE ordered to look for wall motion abnormalities.  Consulted cardiology who is evaluated the patient, appreciate recommendations. Plan for cardiac catheterization on Tuesday - TTE -TSH, lipid panel, A1c -Heparin -Aspirin - telemetry    FEN: No IV fluids, replete lytes prn, heart diet VTE ppx: Heparin Code Status: FULL   Dispo: Anticipated discharge in 3-4 days  Tamsen Snider, MD PGY1  [phone]

## 2018-12-15 DIAGNOSIS — I200 Unstable angina: Secondary | ICD-10-CM

## 2018-12-15 LAB — HEPARIN LEVEL (UNFRACTIONATED): Heparin Unfractionated: 0.5 IU/mL (ref 0.30–0.70)

## 2018-12-15 LAB — CBC
HCT: 41.4 % (ref 36.0–46.0)
Hemoglobin: 13.5 g/dL (ref 12.0–15.0)
MCH: 29.3 pg (ref 26.0–34.0)
MCHC: 32.6 g/dL (ref 30.0–36.0)
MCV: 90 fL (ref 80.0–100.0)
Platelets: 158 10*3/uL (ref 150–400)
RBC: 4.6 MIL/uL (ref 3.87–5.11)
RDW: 13.7 % (ref 11.5–15.5)
WBC: 6.2 10*3/uL (ref 4.0–10.5)
nRBC: 0 % (ref 0.0–0.2)

## 2018-12-15 MED ORDER — SODIUM CHLORIDE 0.9 % WEIGHT BASED INFUSION
3.0000 mL/kg/h | INTRAVENOUS | Status: DC
  Administered 2018-12-16: 3 mL/kg/h via INTRAVENOUS

## 2018-12-15 MED ORDER — SODIUM CHLORIDE 0.9 % IV SOLN: 250.0000 mL | INTRAVENOUS | Status: DC | PRN

## 2018-12-15 MED ORDER — SODIUM CHLORIDE 0.9% FLUSH: 3.0000 mL | INTRAVENOUS | Status: DC | PRN

## 2018-12-15 MED ORDER — SODIUM CHLORIDE 0.9 % WEIGHT BASED INFUSION: 1.0000 mL/kg/h | INTRAVENOUS | Status: DC

## 2018-12-15 MED ORDER — SODIUM CHLORIDE 0.9% FLUSH
3.0000 mL | Freq: Two times a day (BID) | INTRAVENOUS | Status: DC
  Administered 2018-12-15 – 2018-12-16 (×2): 3 mL via INTRAVENOUS

## 2018-12-15 NOTE — Progress Notes (Signed)
Progress Note  Patient Name: Janice Allen Date of Encounter: 12/15/2018  Primary Cardiologist:   No primary care provider on file.   Subjective   No chest pain.  No SOB.   Inpatient Medications    Scheduled Meds: . aspirin  81 mg Oral Daily  . atorvastatin  40 mg Oral q1800   Continuous Infusions: . heparin 750 Units/hr (12/14/18 1519)   PRN Meds: acetaminophen **OR** acetaminophen, ALPRAZolam, senna-docusate   Vital Signs    Vitals:   12/13/18 2154 12/14/18 1335 12/14/18 2146 12/15/18 0643  BP: (!) 107/95 117/70 111/67 105/69  Pulse: 71 67 76 67  Resp: 18 17 16 16   Temp: 97.7 F (36.5 C) 98.2 F (36.8 C) 98.1 F (36.7 C) 97.9 F (36.6 C)  TempSrc: Oral Oral Oral Oral  SpO2: 93% 96% 98% 97%  Weight:  73.1 kg  73.4 kg  Height:  5\' 1"  (1.549 m)      Intake/Output Summary (Last 24 hours) at 12/15/2018 0821 Last data filed at 12/14/2018 1700 Gross per 24 hour  Intake 88.32 ml  Output -  Net 88.32 ml   Filed Weights   12/14/18 1335 12/15/18 0643  Weight: 73.1 kg 73.4 kg    Telemetry   NSR  - Personally Reviewed  ECG    NA- Personally Reviewed  Physical Exam   GEN: No acute distress.   Neck: No  JVD Cardiac: RRR, no murmurs, rubs, or gallops.  Respiratory: Clear  to auscultation bilaterally. GI: Soft, nontender, non-distended  MS: No  edema; No deformity. Neuro:  Nonfocal  Psych: Normal affect   Labs    Chemistry Recent Labs  Lab 12/13/18 1205 12/14/18 0619  NA 137 137  K 4.2 4.1  CL 102 103  CO2 24 25  GLUCOSE 112* 104*  BUN 23 18  CREATININE 0.82 0.79  CALCIUM 10.0 9.3  PROT 7.0  --   ALBUMIN 4.2  --   AST 19  --   ALT 15  --   ALKPHOS 70  --   BILITOT 0.7  --   GFRNONAA >60 >60  GFRAA >60 >60  ANIONGAP 11 9     Hematology Recent Labs  Lab 12/13/18 1205 12/14/18 0619 12/15/18 0238  WBC 8.3 6.0 6.2  RBC 5.03 5.02 4.60  HGB 14.6 14.4 13.5  HCT 45.0 44.8 41.4  MCV 89.5 89.2 90.0  MCH 29.0 28.7 29.3  MCHC 32.4  32.1 32.6  RDW 13.4 13.7 13.7  PLT 185 197 158    Cardiac EnzymesNo results for input(s): TROPONINI in the last 168 hours. No results for input(s): TROPIPOC in the last 168 hours.   BNPNo results for input(s): BNP, PROBNP in the last 168 hours.   DDimer No results for input(s): DDIMER in the last 168 hours.   Radiology    Dg Chest 2 View  Result Date: 12/13/2018 CLINICAL DATA:  Pt to ER for evaluation of left chest, shoulder, and arm aching onset 2 hours ago with nausea. She is a/o x4. She denies weakness. Denies shortness of breath. NAD at this time. PR triage RN 9/5 EXAM: CHEST - 2 VIEW COMPARISON:  Chest radiograph 11/12/2017, 05/31/2011 FINDINGS: Stable cardiomediastinal contours. No new focal infiltrate. No pneumothorax or pleural effusion. Visualized upper abdomen is unremarkable. Status post left shoulder arthroplasty. No acute osseous abnormality. IMPRESSION: No active cardiopulmonary disease. Electronically Signed   By: Audie Pinto M.D.   On: 12/13/2018 11:54   Ct Angio Chest Aorta W/cm &/  or Wo/cm  Result Date: 12/14/2018 CLINICAL DATA:  78 year old female with chest pain. EXAM: CT ANGIOGRAPHY CHEST WITH CONTRAST TECHNIQUE: Multidetector CT imaging of the chest was performed using the standard protocol during bolus administration of intravenous contrast. Multiplanar CT image reconstructions and MIPs were obtained to evaluate the vascular anatomy. CONTRAST:  75mL OMNIPAQUE IOHEXOL 350 MG/ML SOLN COMPARISON:  Chest CT dated 02/22/2012 FINDINGS: Cardiovascular: There is no cardiomegaly or pericardial effusion. Coronary vascular calcification as well as partial calcification of the mitral annulus. Six mild atherosclerotic calcification of the thoracic aorta. No aneurysmal dilatation or dissection. The origins of the great vessels of the aortic arch appear patent as visualized. There is no CT evidence of pulmonary embolism. Mediastinum/Nodes: There is no hilar or mediastinal adenopathy.  The esophagus is grossly unremarkable. No mediastinal fluid collection. Lungs/Pleura: An 8 mm ground-glass nodularity in the lingula (series 7, image 93) most likely represents an area of scarring. Attention on follow-up imaging recommended. The lungs are otherwise clear. There is no pleural effusion or pneumothorax. The central airways are patent. Upper Abdomen: No acute abnormality. Musculoskeletal: Multilevel degenerative changes of the spine. Left shoulder arthroplasty. No acute osseous pathology. Review of the MIP images confirms the above findings. IMPRESSION: No acute intrathoracic pathology. No CT evidence of pulmonary embolism. Aortic Atherosclerosis (ICD10-I70.0). Electronically Signed   By: Elgie CollardArash  Radparvar M.D.   On: 12/14/2018 21:52    Cardiac Studies   ECHO:    1. The left ventricle has normal systolic function with an ejection fraction of 60-65%. The cavity size was normal. There is mild concentric left ventricular hypertrophy. Left ventricular diastolic Doppler parameters are indeterminate. 2. The right ventricle has normal systolic function. The cavity was mildly enlarged. There is no increase in right ventricular wall thickness. 3. The aortic valve is tricuspid. Aortic valve regurgitation is mild to moderate by color flow Doppler. 4. Trivial pericardial effusion is present. 5. There is an echodensity in the ascending aorta that is not well-visualized and may just represent artifact but in the setting of acute chest pain with mild-moderate AI, cannot rule out dissection flap. Recommend CTA for further evaluation. D/w primary team, Dr Crista ElliotHarbrecht, at 17:15 on 12/14/18  Patient Profile     78 y.o. female without prior cardiac history but with elbow pain and an elevated troponin.    Assessment & Plan    ARM PAIN:   Elevated troponin.  Cath tomorrow.  She is on the board for cath.  There was a question of a dissection flap in the aorta but no evidence of this on CT.  Continue  current therapy.    DYSLIPIDEMIA:  Lipitor started.   For questions or updates, please contact [facility] Please consult [email or web address] for contact info under Cardiology/STEMI.   Signed, Rollene RotundaJames Aizley Stenseth, MD  12/15/2018, 8:21 AM

## 2018-12-15 NOTE — Progress Notes (Signed)
   Subjective:   Patient denies any chest pain or shortness of breath. Discussed results of labs that we checked.   Objective:  Vital signs in last 24 hours: Vitals:   12/13/18 2154 12/14/18 1335 12/14/18 2146 12/15/18 0643  BP: (!) 107/95 117/70 111/67 105/69  Pulse: 71 67 76 67  Resp: 18 17 16 16   Temp: 97.7 F (36.5 C) 98.2 F (36.8 C) 98.1 F (36.7 C) 97.9 F (36.6 C)  TempSrc: Oral Oral Oral Oral  SpO2: 93% 96% 98% 97%  Weight:  73.1 kg  73.4 kg  Height:  5\' 1"  (1.549 m)      Physical Exam Constitutional:      Appearance: Normal appearance. She is not diaphoretic.  Cardiovascular:     Rate and Rhythm: Normal rate and regular rhythm.     Heart sounds: No murmur. No friction rub. No gallop.   Pulmonary:     Breath sounds: Normal breath sounds. No wheezing, rhonchi or rales.  Skin:    General: Skin is warm.     Findings: No bruising or rash.  Neurological:     Mental Status: She is alert.     Assessment/Plan:  Principal Problem:   Unstable angina (HCC) Active Problems:   Elevated troponin   Hyperlipidemia  78 year old woman who presented with atypical chest pain.  High-sensitivity troponin 7> 13.  Patient asymptomatic on exam this morning however repeat troponin elevated to 268.  ST depressions in V3 through V5. TTE ordered to look for wall motion abnormalities.  Consulted cardiology who is evaluated the patient, appreciate recommendations. Plan for cardiac catheterization on Tuesday. TSH nl, LDL 122, HDL 96, . A1c 5.9 -Heparin -Aspirin - telemetry    FEN: No IV fluids, replete lytes prn, heart diet VTE ppx: Heparin Code Status: FULL   Dispo: Anticipated discharge in 3-4 days  Tamsen Snider, MD PGY1  [phone]

## 2018-12-15 NOTE — Progress Notes (Signed)
Janice Allen for heparin  Indication: chest pain/ACS  No Known Allergies  Patient Measurements: Height: 5\' 1"  (154.9 cm) Weight: 161 lb 14.4 oz (73.4 kg) IBW/kg (Calculated) : 47.8  Heparin Dosing Weight: 63.8  Vital Signs: Temp: 97.9 F (36.6 C) (09/07 0643) Temp Source: Oral (09/07 0643) BP: 105/69 (09/07 0643) Pulse Rate: 67 (09/07 0643)  Labs: Recent Labs    12/13/18 1205 12/13/18 1312 12/14/18 0619 12/14/18 1020 12/14/18 2209 12/15/18 0238  HGB 14.6  --  14.4  --   --  13.5  HCT 45.0  --  44.8  --   --  41.4  PLT 185  --  197  --   --  158  HEPARINUNFRC  --   --   --   --  0.53 0.50  CREATININE 0.82  --  0.79  --   --   --   TROPONINIHS 7 13  --  268*  --   --     Estimated Creatinine Clearance: 53.9 mL/min (by C-G formula based on SCr of 0.79 mg/dL).   Medical History: Past Medical History:  Diagnosis Date  . Anemia    hx of approx 40 years ago after prior surgery  . Anxiety   . Arthritis   . Bronchitis    approx 2 years ago  . Depression   . GERD (gastroesophageal reflux disease)   . Heart palpitations 2012   Palpitations after d/c from prior surgery. Pt thinks it was related to anxiety . denies any at this time    Medications:  Scheduled:  . aspirin  81 mg Oral Daily  . atorvastatin  40 mg Oral q1800  . sodium chloride flush  3 mL Intravenous Q12H   . sodium chloride    . [START ON 12/16/2018] sodium chloride     Followed by  . [START ON 12/16/2018] sodium chloride    . heparin 750 Units/hr (12/14/18 2104)    Assessment: Pt is a 51 yof who presented with pain consistent of unstable angina and has elevated troponins. Cath planned for Tuesday.  No anticoagulation PTA.   Heparin level remained therapeutic this morning at 0.50. CBC is also stable with H&H of 13.5/41.4 and plts wnl.   Goal of Therapy:  Heparin level 0.3-0.7 units/ml Monitor platelets by anticoagulation protocol: Yes   Plan:  Continue  heparin infusion at 750 units/hr Check anti-Xa level daily while on heparin Continue to monitor H&H and platelets    Thank you,   Eddie Candle, PharmD PGY-1 Pharmacy Resident   Please check amion for clinical pharmacist contact number 12/15/2018,12:40 PM

## 2018-12-16 ENCOUNTER — Encounter (HOSPITAL_COMMUNITY): Payer: Self-pay | Admitting: Cardiology

## 2018-12-16 ENCOUNTER — Encounter (HOSPITAL_COMMUNITY)
Admission: EM | Disposition: A | Discharge status: Home / Self Care | Payer: Self-pay | Source: Home / Self Care | Attending: Student in an Organized Health Care Education/Training Program

## 2018-12-16 DIAGNOSIS — M25522 Pain in left elbow: Secondary | ICD-10-CM

## 2018-12-16 DIAGNOSIS — E7800 Pure hypercholesterolemia, unspecified: Secondary | ICD-10-CM

## 2018-12-16 DIAGNOSIS — M25521 Pain in right elbow: Secondary | ICD-10-CM

## 2018-12-16 DIAGNOSIS — R7989 Other specified abnormal findings of blood chemistry: Secondary | ICD-10-CM

## 2018-12-16 DIAGNOSIS — I214 Non-ST elevation (NSTEMI) myocardial infarction: Secondary | ICD-10-CM

## 2018-12-16 HISTORY — PX: LEFT HEART CATH AND CORONARY ANGIOGRAPHY: CATH118249

## 2018-12-16 LAB — CBC
HCT: 40.3 % (ref 36.0–46.0)
Hemoglobin: 13.3 g/dL (ref 12.0–15.0)
MCH: 29.5 pg (ref 26.0–34.0)
MCHC: 33 g/dL (ref 30.0–36.0)
MCV: 89.4 fL (ref 80.0–100.0)
Platelets: 159 10*3/uL (ref 150–400)
RBC: 4.51 MIL/uL (ref 3.87–5.11)
RDW: 13.5 % (ref 11.5–15.5)
WBC: 5.7 10*3/uL (ref 4.0–10.5)
nRBC: 0 % (ref 0.0–0.2)

## 2018-12-16 LAB — BASIC METABOLIC PANEL
Anion gap: 8 (ref 5–15)
BUN: 20 mg/dL (ref 8–23)
CO2: 23 mmol/L (ref 22–32)
Calcium: 8.9 mg/dL (ref 8.9–10.3)
Chloride: 107 mmol/L (ref 98–111)
Creatinine, Ser: 0.74 mg/dL (ref 0.44–1.00)
GFR calc Af Amer: >60 mL/min (ref >60)
GFR calc non Af Amer: >60 mL/min (ref >60)
Glucose, Bld: 104 mg/dL — ABNORMAL HIGH (ref 70–99)
Potassium: 4.3 mmol/L (ref 3.5–5.1)
Sodium: 138 mmol/L (ref 135–145)

## 2018-12-16 LAB — HEPARIN LEVEL (UNFRACTIONATED): Heparin Unfractionated: 0.48 IU/mL (ref 0.30–0.70)

## 2018-12-16 SURGERY — LEFT HEART CATH AND CORONARY ANGIOGRAPHY
Anesthesia: LOCAL

## 2018-12-16 MED ORDER — SODIUM CHLORIDE 0.9% FLUSH: 3.0000 mL | INTRAVENOUS | Status: DC | PRN

## 2018-12-16 MED ORDER — VERAPAMIL HCL 2.5 MG/ML IV SOLN
INTRAVENOUS | Status: DC | PRN
  Administered 2018-12-16: 10 mL via INTRA_ARTERIAL

## 2018-12-16 MED ORDER — FENTANYL CITRATE (PF) 100 MCG/2ML IJ SOLN
INTRAMUSCULAR | Status: AC
  Filled 2018-12-16: qty 2

## 2018-12-16 MED ORDER — MIDAZOLAM HCL 2 MG/2ML IJ SOLN
INTRAMUSCULAR | Status: AC
  Filled 2018-12-16: qty 2

## 2018-12-16 MED ORDER — HEPARIN (PORCINE) IN NACL 1000-0.9 UT/500ML-% IV SOLN
INTRAVENOUS | Status: DC | PRN
  Administered 2018-12-16 (×2): 500 mL

## 2018-12-16 MED ORDER — LIDOCAINE HCL (PF) 1 % IJ SOLN
INTRAMUSCULAR | Status: DC | PRN
  Administered 2018-12-16: 3 mL

## 2018-12-16 MED ORDER — HEPARIN SODIUM (PORCINE) 1000 UNIT/ML IJ SOLN
INTRAMUSCULAR | Status: DC | PRN
  Administered 2018-12-16: 4000 [IU] via INTRAVENOUS

## 2018-12-16 MED ORDER — HYDRALAZINE HCL 20 MG/ML IJ SOLN: 10.0000 mg | INTRAMUSCULAR | Status: DC | PRN

## 2018-12-16 MED ORDER — MIDAZOLAM HCL 2 MG/2ML IJ SOLN
INTRAMUSCULAR | Status: DC | PRN
  Administered 2018-12-16: 1 mg via INTRAVENOUS

## 2018-12-16 MED ORDER — SODIUM CHLORIDE 0.9 % IV SOLN: 250.0000 mL | INTRAVENOUS | Status: DC | PRN

## 2018-12-16 MED ORDER — LABETALOL HCL 5 MG/ML IV SOLN: 10.0000 mg | INTRAVENOUS | Status: DC | PRN

## 2018-12-16 MED ORDER — SODIUM CHLORIDE 0.9 % IV SOLN: INTRAVENOUS | Status: AC

## 2018-12-16 MED ORDER — HEPARIN SODIUM (PORCINE) 1000 UNIT/ML IJ SOLN
INTRAMUSCULAR | Status: AC
  Filled 2018-12-16: qty 1

## 2018-12-16 MED ORDER — SODIUM CHLORIDE 0.9% FLUSH: 3.0000 mL | Freq: Two times a day (BID) | INTRAVENOUS | Status: DC

## 2018-12-16 MED ORDER — IOHEXOL 350 MG/ML SOLN
INTRAVENOUS | Status: DC | PRN
  Administered 2018-12-16: 55 mL via INTRA_ARTERIAL

## 2018-12-16 MED ORDER — LIDOCAINE HCL (PF) 1 % IJ SOLN
INTRAMUSCULAR | Status: AC
  Filled 2018-12-16: qty 30

## 2018-12-16 MED ORDER — HEPARIN (PORCINE) IN NACL 1000-0.9 UT/500ML-% IV SOLN
INTRAVENOUS | Status: AC
  Filled 2018-12-16: qty 1000

## 2018-12-16 MED ORDER — VERAPAMIL HCL 2.5 MG/ML IV SOLN
INTRAVENOUS | Status: AC
  Filled 2018-12-16: qty 2

## 2018-12-16 MED ORDER — FENTANYL CITRATE (PF) 100 MCG/2ML IJ SOLN
INTRAMUSCULAR | Status: DC | PRN
  Administered 2018-12-16 (×2): 25 ug via INTRAVENOUS

## 2018-12-16 SURGICAL SUPPLY — 10 items
CATH OPTITORQUE TIG 4.0 5F (CATHETERS) ×1 IMPLANT
DEVICE RAD COMP TR BAND LRG (VASCULAR PRODUCTS) ×1 IMPLANT
GLIDESHEATH SLEND SS 6F .021 (SHEATH) ×1 IMPLANT
GUIDEWIRE INQWIRE 1.5J.035X260 (WIRE) IMPLANT
INQWIRE 1.5J .035X260CM (WIRE) ×2
KIT HEART LEFT (KITS) ×2 IMPLANT
PACK CARDIAC CATHETERIZATION (CUSTOM PROCEDURE TRAY) ×2 IMPLANT
SHEATH PROBE COVER 6X72 (BAG) ×1 IMPLANT
TRANSDUCER W/STOPCOCK (MISCELLANEOUS) ×2 IMPLANT
TUBING CIL FLEX 10 FLL-RA (TUBING) ×2 IMPLANT

## 2018-12-16 NOTE — Progress Notes (Signed)
ANTICOAGULATION CONSULT NOTE   Pharmacy Consult for heparin  Indication: chest pain/ACS  No Known Allergies  Patient Measurements: Height: 5\' 1"  (154.9 cm) Weight: 162 lb 14.7 oz (73.9 kg) IBW/kg (Calculated) : 47.8  Heparin Dosing Weight: 63.8  Vital Signs: Temp: 97.5 F (36.4 C) (09/08 0441) Temp Source: Oral (09/08 0441) BP: 124/63 (09/08 0441) Pulse Rate: 67 (09/08 0441)  Labs: Recent Labs    12/13/18 1205 12/13/18 1312 12/14/18 0619 12/14/18 1020 12/14/18 2209 12/15/18 0238 12/16/18 0429 12/16/18 0957  HGB 14.6  --  14.4  --   --  13.5 13.3  --   HCT 45.0  --  44.8  --   --  41.4 40.3  --   PLT 185  --  197  --   --  158 159  --   HEPARINUNFRC  --   --   --   --  0.53 0.50 0.48  --   CREATININE 0.82  --  0.79  --   --   --   --  0.74  TROPONINIHS 7 13  --  268*  --   --   --   --     Estimated Creatinine Clearance: 54.1 mL/min (by C-G formula based on SCr of 0.74 mg/dL).   Medical History: Past Medical History:  Diagnosis Date  . Anemia    hx of approx 40 years ago after prior surgery  . Anxiety   . Arthritis   . Bronchitis    approx 2 years ago  . Depression   . GERD (gastroesophageal reflux disease)   . Heart palpitations 2012   Palpitations after d/c from prior surgery. Pt thinks it was related to anxiety . denies any at this time    Medications:  Scheduled:  . aspirin  81 mg Oral Daily  . atorvastatin  40 mg Oral q1800  . sodium chloride flush  3 mL Intravenous Q12H   . sodium chloride    . sodium chloride 1 mL/kg/hr (12/16/18 0547)  . heparin 750 Units/hr (12/16/18 0600)    Assessment: Pt is a 29 yof who presented with pain consistent of unstable angina and has elevated troponins. Cath planned for Tuesday.  No anticoagulation PTA.   Heparin level remains therapeutic this morning at 0.48. CBC is also stable.    Goal of Therapy:  Heparin level 0.3-0.7 units/ml Monitor platelets by anticoagulation protocol: Yes   Plan:  Continue  heparin infusion at 750 units/hr Plan for cath today  Thank you,   Erin Hearing PharmD., BCPS Clinical Pharmacist 12/16/2018 10:57 AM  Please check amion for clinical pharmacist contact number 12/16/2018,10:56 AM

## 2018-12-16 NOTE — Progress Notes (Signed)
   Subjective:  Pt is sitting up comfortably in bed, denies chest pain and SOB. She says she slept better last night. She is ready for her procedure. She was wondering whether her sx were consistent with a stroke but the team explained her presenting sx weren't consistent with CVA. She is positive about today's procedure and feels like she is in good hands. She has no other complaints or concerns.   NPO for heart cath today  Objective:  Vital signs in last 24 hours: Vitals:   12/15/18 1343 12/15/18 2134 12/16/18 0441 12/16/18 0449  BP: (!) 110/55 117/62 124/63   Pulse: 74 71 67   Resp:   14   Temp: 97.6 F (36.4 C) 98.2 F (36.8 C) (!) 97.5 F (36.4 C)   TempSrc: Oral Oral Oral   SpO2: 97% 96% 98%   Weight:    73.9 kg  Height:        Physical Exam Constitutional:      Appearance: Normal appearance. She is not diaphoretic.  Cardiovascular:     Rate and Rhythm: Normal rate and regular rhythm.     Heart sounds: No murmur. No friction rub. No gallop.   Pulmonary:     Breath sounds: Normal breath sounds. No wheezing, rhonchi or rales.  Skin:    General: Skin is warm.     Findings: No bruising or rash.  Neurological:     Mental Status: She is alert.     Assessment/Plan:  Principal Problem:   Unstable angina (HCC) Active Problems:   Elevated troponin   Hyperlipidemia  78 year old woman who presented with atypical chest pain.  High-sensitivity troponin 7> 13.  Patient asymptomatic on exam , however repeat troponin elevated to 268.  ST depressions in V3 through V5. TTE ordered to look for wall motion abnormalities.  Consulted cardiology who is evaluated the patient, appreciate recommendations. Plan for cardiac catheterization today.  TSH nl, LDL 122, HDL 96, . A1c 5.9 -Heparin -Aspirin - telemetry     FEN: No IV fluids, replete lytes prn, NPO for surgery VTE ppx: Heparin Code Status: FULL   Dispo: Anticipated discharge in 1-2 days   Janice Snider, MD PGY1   [phone]

## 2018-12-16 NOTE — Progress Notes (Signed)
  Date: 12/16/2018  Patient name: Janice Allen  Medical record number: [id number]  Date of birth: [birth year]        I have seen and evaluated this patient and I have discussed the plan of care with the house staff. Please see Dr. Lonzo Candy note for complete details. I concur with his findings and plan.    Sid Falcon, MD 12/16/2018, 8:26 PM

## 2018-12-16 NOTE — Progress Notes (Addendum)
Progress Note  Patient Name: Janice Allen Date of Encounter: 12/16/2018  Primary Cardiologist: Minus Breeding, MD   Subjective   Patient is feeling better this better this morning. She has had some occasional fleeting chest discomfort but not recurrent ache as before. No recurrent arm pain. Denies SOB. Awaiting cardiac cath.   Inpatient Medications    Scheduled Meds:  aspirin  81 mg Oral Daily   atorvastatin  40 mg Oral q1800   sodium chloride flush  3 mL Intravenous Q12H   Continuous Infusions:  sodium chloride     sodium chloride 1 mL/kg/hr (12/16/18 0547)   heparin 750 Units/hr (12/16/18 0600)   PRN Meds: sodium chloride, acetaminophen **OR** acetaminophen, ALPRAZolam, senna-docusate, sodium chloride flush   Vital Signs    Vitals:   12/15/18 1343 12/15/18 2134 12/16/18 0441 12/16/18 0449  BP: (!) 110/55 117/62 124/63   Pulse: 74 71 67   Resp:   14   Temp: 97.6 F (36.4 C) 98.2 F (36.8 C) (!) 97.5 F (36.4 C)   TempSrc: Oral Oral Oral   SpO2: 97% 96% 98%   Weight:    73.9 kg  Height:        Intake/Output Summary (Last 24 hours) at 12/16/2018 0755 Last data filed at 12/16/2018 0615 Gross per 24 hour  Intake 1014.37 ml  Output --  Net 1014.37 ml   Last 3 Weights 12/16/2018 12/15/2018 12/14/2018  Weight (lbs) 162 lb 14.7 oz 161 lb 14.4 oz 161 lb 3.2 oz  Weight (kg) 73.9 kg 73.437 kg 73.12 kg      Telemetry    NSR, HR 60-70s, no other arrhythmias noted - Personally Reviewed  ECG    No new - Personally Reviewed  Physical Exam   GEN: No acute distress.   Neck: No JVD Cardiac: RRR, no murmurs, rubs, or gallops.  Respiratory: Clear to auscultation bilaterally. GI: Soft, nontender, non-distended  MS: No edema; No deformity. Neuro:  Nonfocal  Psych: Normal affect   Labs    High Sensitivity Troponin:   Recent Labs  Lab 12/13/18 1205 12/13/18 1312 12/14/18 1020  TROPONINIHS 7 13 268*      Chemistry Recent Labs  Lab 12/13/18 1205  12/14/18 0619  NA 137 137  K 4.2 4.1  CL 102 103  CO2 24 25  GLUCOSE 112* 104*  BUN 23 18  CREATININE 0.82 0.79  CALCIUM 10.0 9.3  PROT 7.0  --   ALBUMIN 4.2  --   AST 19  --   ALT 15  --   ALKPHOS 70  --   BILITOT 0.7  --   GFRNONAA >60 >60  GFRAA >60 >60  ANIONGAP 11 9     Hematology Recent Labs  Lab 12/14/18 0619 12/15/18 0238 12/16/18 0429  WBC 6.0 6.2 5.7  RBC 5.02 4.60 4.51  HGB 14.4 13.5 13.3  HCT 44.8 41.4 40.3  MCV 89.2 90.0 89.4  MCH 28.7 29.3 29.5  MCHC 32.1 32.6 33.0  RDW 13.7 13.7 13.5  PLT 197 158 159    BNPNo results for input(s): BNP, PROBNP in the last 168 hours.   DDimer No results for input(s): DDIMER in the last 168 hours.   Radiology    Ct Angio Chest Aorta W/cm &/or Wo/cm  Result Date: 12/14/2018 CLINICAL DATA:  78 year old female with chest pain. EXAM: CT ANGIOGRAPHY CHEST WITH CONTRAST TECHNIQUE: Multidetector CT imaging of the chest was performed using the standard protocol during bolus administration of intravenous contrast.  Multiplanar CT image reconstructions and MIPs were obtained to evaluate the vascular anatomy. CONTRAST:  52mL OMNIPAQUE IOHEXOL 350 MG/ML SOLN COMPARISON:  Chest CT dated 02/22/2012 FINDINGS: Cardiovascular: There is no cardiomegaly or pericardial effusion. Coronary vascular calcification as well as partial calcification of the mitral annulus. Six mild atherosclerotic calcification of the thoracic aorta. No aneurysmal dilatation or dissection. The origins of the great vessels of the aortic arch appear patent as visualized. There is no CT evidence of pulmonary embolism. Mediastinum/Nodes: There is no hilar or mediastinal adenopathy. The esophagus is grossly unremarkable. No mediastinal fluid collection. Lungs/Pleura: An 8 mm ground-glass nodularity in the lingula (series 7, image 93) most likely represents an area of scarring. Attention on follow-up imaging recommended. The lungs are otherwise clear. There is no pleural  effusion or pneumothorax. The central airways are patent. Upper Abdomen: No acute abnormality. Musculoskeletal: Multilevel degenerative changes of the spine. Left shoulder arthroplasty. No acute osseous pathology. Review of the MIP images confirms the above findings. IMPRESSION: No acute intrathoracic pathology. No CT evidence of pulmonary embolism. Aortic Atherosclerosis (ICD10-I70.0). Electronically Signed   By: Elgie Collard M.D.   On: 12/14/2018 21:52    Cardiac Studies   ECHO:    1. The left ventricle has normal systolic function with an ejection fraction of 60-65%. The cavity size was normal. There is mild concentric left ventricular hypertrophy. Left ventricular diastolic Doppler parameters are indeterminate. 2. The right ventricle has normal systolic function. The cavity was mildly enlarged. There is no increase in right ventricular wall thickness. 3. The aortic valve is tricuspid. Aortic valve regurgitation is mild to moderate by color flow Doppler. 4. Trivial pericardial effusion is present. 5. There is an echodensity in the ascending aorta that is not well-visualized and may just represent artifact but in the setting of acute chest pain with mild-moderate AI, cannot rule out dissection flap. Recommend CTA for further evaluation. D/w primary team, Dr Crista Elliot, at 17:15 on 12/14/18  Patient Profile     78 y.o. female without prior cardiac history but with bilateral elbow pain and an elevated troponin.    Assessment & Plan   NSTEMI - Patient presented with atypical chest pain. HS trop peaked at 268 with subtle EKG changes anterior t wave inversions - No prior cardiac history - Echo showed EF 60-65%, trivial pericardial effusion, and possiple dissection flap. F/u CTA was negative for dissection flap.  - Plan for cath today - Last creatinine 0.79. Am labs pending - Admits to occasional fleeting chest discomfort, but recurrent chest pain/ache - Continue  Aspirin  Dyslipidemia - Lipitor 40 mg daily started - LDL 122, HDL 65, TG 96, Cholesterol 206  For questions or updates, please contact [facility] Please consult [email or web address] for contact info under        Signed, Cadence David Stall, PA-C  12/16/2018, 7:55 AM    Personally seen and examined. Agree with above.   Currently improved.  No shortness of breath.  GEN: Well nourished, well developed, in no acute distress  HEENT: normal  Neck: no JVD, carotid bruits, or masses Cardiac: RRR; no murmurs, rubs, or gallops,no edema  Respiratory:  clear to auscultation bilaterally, normal work of breathing GI: soft, nontender, nondistended, + BS MS: no deformity or atrophy  Skin: warm and dry, no rash Neuro:  Alert and Oriented x 3, Strength and sensation are intact Psych: euthymic mood, full affect  Lab work reviewed-troponin 268.  Non-ST elevation myocardial infarction - EF 65% with trivial pericardial effusion.  CTA negative for any evidence of dissection flap.  This is a common artifact seen on transthoracic echocardiography. -Plan on cardiac catheterization.  Creatinine stable.  Rare fleeting chest discomfort.  Troponin elevation.  Hyperlipidemia -LDL 122-atorvastatin 40, high intensity.  Goal will be less than 70.  Donato SchultzMark Takasha Vetere, MD

## 2018-12-16 NOTE — Interval H&P Note (Signed)
History and Physical Interval Note:  12/16/2018 12:02 PM  Janice Allen  has presented today for surgery, with the diagnosis of unstable angina - Demand ischemia vs. NSTEMI.  The various methods of treatment have been discussed with the patient and family. After consideration of risks, benefits and other options for treatment, the patient has consented to  Procedure(s): LEFT HEART CATH AND CORONARY ANGIOGRAPHY (N/A)  PERCUTANEOUS CORONARY INTERVENTION  as a surgical intervention.  The patient's history has been reviewed, patient examined, no change in status, stable for surgery.  I have reviewed the patient's chart and labs.  Questions were answered to the patient's satisfaction.    Cath Lab Visit (complete for each Cath Lab visit)  Clinical Evaluation Leading to the Procedure:   ACS: Yes.    Non-ACS:    Anginal Classification: CCS III  Anti-ischemic medical therapy: No Therapy  Non-Invasive Test Results: No non-invasive testing performed  Prior CABG: No previous CABG     Glenetta Hew

## 2018-12-16 NOTE — H&P (View-Only) (Signed)
Progress Note  Patient Name: Janice Allen Date of Encounter: 12/16/2018  Primary Cardiologist: Minus Breeding, MD   Subjective   Patient is feeling better this better this morning. She has had some occasional fleeting chest discomfort but not recurrent ache as before. No recurrent arm pain. Denies SOB. Awaiting cardiac cath.   Inpatient Medications    Scheduled Meds:  aspirin  81 mg Oral Daily   atorvastatin  40 mg Oral q1800   sodium chloride flush  3 mL Intravenous Q12H   Continuous Infusions:  sodium chloride     sodium chloride 1 mL/kg/hr (12/16/18 0547)   heparin 750 Units/hr (12/16/18 0600)   PRN Meds: sodium chloride, acetaminophen **OR** acetaminophen, ALPRAZolam, senna-docusate, sodium chloride flush   Vital Signs    Vitals:   12/15/18 1343 12/15/18 2134 12/16/18 0441 12/16/18 0449  BP: (!) 110/55 117/62 124/63   Pulse: 74 71 67   Resp:   14   Temp: 97.6 F (36.4 C) 98.2 F (36.8 C) (!) 97.5 F (36.4 C)   TempSrc: Oral Oral Oral   SpO2: 97% 96% 98%   Weight:    73.9 kg  Height:        Intake/Output Summary (Last 24 hours) at 12/16/2018 0755 Last data filed at 12/16/2018 0615 Gross per 24 hour  Intake 1014.37 ml  Output --  Net 1014.37 ml   Last 3 Weights 12/16/2018 12/15/2018 12/14/2018  Weight (lbs) 162 lb 14.7 oz 161 lb 14.4 oz 161 lb 3.2 oz  Weight (kg) 73.9 kg 73.437 kg 73.12 kg      Telemetry    NSR, HR 60-70s, no other arrhythmias noted - Personally Reviewed  ECG    No new - Personally Reviewed  Physical Exam   GEN: No acute distress.   Neck: No JVD Cardiac: RRR, no murmurs, rubs, or gallops.  Respiratory: Clear to auscultation bilaterally. GI: Soft, nontender, non-distended  MS: No edema; No deformity. Neuro:  Nonfocal  Psych: Normal affect   Labs    High Sensitivity Troponin:   Recent Labs  Lab 12/13/18 1205 12/13/18 1312 12/14/18 1020  TROPONINIHS 7 13 268*      Chemistry Recent Labs  Lab 12/13/18 1205  12/14/18 0619  NA 137 137  K 4.2 4.1  CL 102 103  CO2 24 25  GLUCOSE 112* 104*  BUN 23 18  CREATININE 0.82 0.79  CALCIUM 10.0 9.3  PROT 7.0  --   ALBUMIN 4.2  --   AST 19  --   ALT 15  --   ALKPHOS 70  --   BILITOT 0.7  --   GFRNONAA >60 >60  GFRAA >60 >60  ANIONGAP 11 9     Hematology Recent Labs  Lab 12/14/18 0619 12/15/18 0238 12/16/18 0429  WBC 6.0 6.2 5.7  RBC 5.02 4.60 4.51  HGB 14.4 13.5 13.3  HCT 44.8 41.4 40.3  MCV 89.2 90.0 89.4  MCH 28.7 29.3 29.5  MCHC 32.1 32.6 33.0  RDW 13.7 13.7 13.5  PLT 197 158 159    BNPNo results for input(s): BNP, PROBNP in the last 168 hours.   DDimer No results for input(s): DDIMER in the last 168 hours.   Radiology    Ct Angio Chest Aorta W/cm &/or Wo/cm  Result Date: 12/14/2018 CLINICAL DATA:  78 year old female with chest pain. EXAM: CT ANGIOGRAPHY CHEST WITH CONTRAST TECHNIQUE: Multidetector CT imaging of the chest was performed using the standard protocol during bolus administration of intravenous contrast.  Multiplanar CT image reconstructions and MIPs were obtained to evaluate the vascular anatomy. CONTRAST:  75mL OMNIPAQUE IOHEXOL 350 MG/ML SOLN COMPARISON:  Chest CT dated 02/22/2012 FINDINGS: Cardiovascular: There is no cardiomegaly or pericardial effusion. Coronary vascular calcification as well as partial calcification of the mitral annulus. Six mild atherosclerotic calcification of the thoracic aorta. No aneurysmal dilatation or dissection. The origins of the great vessels of the aortic arch appear patent as visualized. There is no CT evidence of pulmonary embolism. Mediastinum/Nodes: There is no hilar or mediastinal adenopathy. The esophagus is grossly unremarkable. No mediastinal fluid collection. Lungs/Pleura: An 8 mm ground-glass nodularity in the lingula (series 7, image 93) most likely represents an area of scarring. Attention on follow-up imaging recommended. The lungs are otherwise clear. There is no pleural  effusion or pneumothorax. The central airways are patent. Upper Abdomen: No acute abnormality. Musculoskeletal: Multilevel degenerative changes of the spine. Left shoulder arthroplasty. No acute osseous pathology. Review of the MIP images confirms the above findings. IMPRESSION: No acute intrathoracic pathology. No CT evidence of pulmonary embolism. Aortic Atherosclerosis (ICD10-I70.0). Electronically Signed   By: Arash  Radparvar M.D.   On: 12/14/2018 21:52  ° ° °Cardiac Studies  ° °ECHO:   °  °1. The left ventricle has normal systolic function with an ejection fraction of 60-65%. The cavity °size was normal. There is mild concentric left ventricular hypertrophy. Left ventricular °diastolic Doppler parameters are indeterminate. °2. The right ventricle has normal systolic function. The cavity was mildly enlarged. There is no °increase in right ventricular wall thickness. °3. The aortic valve is tricuspid. Aortic valve regurgitation is mild to moderate by color flow °Doppler. °4. Trivial pericardial effusion is present. °5. There is an echodensity in the ascending aorta that is not well-visualized and may just °represent artifact but in the setting of acute chest pain with mild-moderate AI, cannot rule out °dissection flap. Recommend CTA for further evaluation. D/w primary team, Dr Harbrecht, at °17:15 on 12/14/18 ° °Patient Profile  °   °78 y.o. female without prior cardiac history but with bilateral elbow pain and an elevated troponin.   ° °Assessment & Plan  ° °NSTEMI °- Patient presented with atypical chest pain. HS trop peaked at 268 with subtle EKG changes anterior t wave inversions °- No prior cardiac history °- Echo showed EF 60-65%, trivial pericardial effusion, and possiple dissection flap. F/u CTA was negative for dissection flap.  °- Plan for cath today °- Last creatinine 0.79. Am labs pending °- Admits to occasional fleeting chest discomfort, but recurrent chest pain/ache °- Continue  Aspirin ° °Dyslipidemia °- Lipitor 40 mg daily started °- LDL 122, HDL 65, TG 96, Cholesterol 206 ° °For questions or updates, please contact [facility] °Please consult [email or web address] for contact info under  ° °  °   °Signed, °Cadence H Furth, PA-C  °12/16/2018, 7:55 AM   ° °Personally seen and examined. Agree with above. ° ° °Currently improved.  No shortness of breath. ° °GEN: Well nourished, well developed, in no acute distress  °HEENT: normal  °Neck: no JVD, carotid bruits, or masses °Cardiac: RRR; no murmurs, rubs, or gallops,no edema  °Respiratory:  clear to auscultation bilaterally, normal work of breathing °GI: soft, nontender, nondistended, + BS °MS: no deformity or atrophy  °Skin: warm and dry, no rash °Neuro:  Alert and Oriented x 3, Strength and sensation are intact °Psych: euthymic mood, full affect ° °Lab work reviewed-troponin 268. ° °Non-ST elevation myocardial infarction °- EF 65% with trivial pericardial effusion.    CTA negative for any evidence of dissection flap.  This is a common artifact seen on transthoracic echocardiography. -Plan on cardiac catheterization.  Creatinine stable.  Rare fleeting chest discomfort.  Troponin elevation.  Hyperlipidemia -LDL 122-atorvastatin 40, high intensity.  Goal will be less than 70.  Donato SchultzMark Johny Pitstick, MD

## 2018-12-18 NOTE — Discharge Summary (Addendum)
Name: Janice Allen MRN: [id number] DOB: Apr 22, 1940 78 y.o. PCP: Einar Pheasant, DO  Date of Admission: 12/13/2018 10:38 AM Date of Discharge: 12/16/2018 Attending Physician:Dr.Emily Daryll Drown Discharge Diagnosis: 1. Chest discomfort and elevated troponin   Discharge Medications: Allergies as of 12/16/2018   No Known Allergies     Medication List    STOP taking these medications   HYDROcodone-acetaminophen 5-325 MG tablet Commonly known as: Norco     TAKE these medications   ibuprofen 200 MG tablet Commonly known as: ADVIL Take 200 mg by mouth every 6 (six) hours as needed for headache or mild pain.   Lubricant Eye Drops 0.4-0.3 % Soln Generic drug: Polyethyl Glycol-Propyl Glycol Place 1 drop into both eyes 3 (three) times daily as needed (for dry eyes.).   NON FORMULARY Place 4 drops under the tongue 3 (three) times daily as needed (for pain/sleep (scheduled in the evening)). CBD OIL   OVER THE COUNTER MEDICATION Take 1 capsule by mouth daily. Milk thistle (Silybum marianum) extract (225 mg) Bacopa (Bacopa monnieri) extract (150 mg) Ashwagandha (Withania somnifera) root (150mg )   selenium 50 MCG Tabs tablet Take 50 mcg by mouth daily.   Vitamin D3 50 MCG (2000 UT) Tabs Take 2,000 Units by mouth daily.   zinc gluconate 50 MG tablet Take 50 mg by mouth daily.       Disposition and follow-up:   Janice Allen was discharged from [facility] in Stable condition.  At the hospital follow up visit please address:  1.    Elevated troponin, chest discomfort - high sensitivity troponin trended 7>13 >268 (next morning) . Alarmed and EKG subtle anterior T wave inversion. No CP  - Cath showed normal coronary arteries with minimal disease - Further workup for chest discomfort, consider GERD , esophogeal spasm, anxiety.  Hyperlipidemia LDL 122 HDL 65 - consider statin. Although patient 35 with minimal CAD. Stopped at discharge, patient reports she  does not like taking medication.  2.  Labs / imaging needed at time of follow-up:  3.  Pending labs/ test needing follow-up:   Follow-up Appointments:   Hospital Course by problem list: 1. Chest discomfort, elbow pain  - Patient presented for to ED after chest discomfort and bilateral elbow pain. Trended tropoinins (high sensitivity), 7>13. Admitted to monitor and patient with no chest pain on day 2. Troponin elevated to 268. Subtle anterior T wave inversions. Patient started on Asprin ,heparin, and statin. Cath showed angiographically normal coronary arteries with minimal disease.     Discharge Vitals:   BP (!) 109/56   Pulse 74   Temp 97.7 F (36.5 C) (Oral)   Resp 19   Ht 5\' 1"  (1.549 m)   Wt 73.9 kg   SpO2 95%   BMI 30.78 kg/m   Pertinent Labs, Studies, and Procedures:  CBC Latest Ref Rng & Units 12/16/2018 12/15/2018 12/14/2018  WBC 4.0 - 10.5 K/uL 5.7 6.2 6.0  Hemoglobin 12.0 - 15.0 g/dL 13.3 13.5 14.4  Hematocrit 36.0 - 46.0 % 40.3 41.4 44.8  Platelets 150 - 400 K/uL 159 158 197   BMP Latest Ref Rng & Units 12/16/2018 12/14/2018 12/13/2018  Glucose 70 - 99 mg/dL 104(H) 104(H) 112(H)  BUN 8 - 23 mg/dL 20 18 23   Creatinine 0.44 - 1.00 mg/dL 0.74 0.79 0.82  Sodium 135 - 145 mmol/L 138 137 137  Potassium 3.5 - 5.1 mmol/L 4.3 4.1 4.2  Chloride 98 - 111 mmol/L 107 103 102  CO2  22 - 32 mmol/L 23 25 24   Calcium 8.9 - 10.3 mg/dL 8.9 9.3 85.6   MR Lumbar Spine Wo Contrast FINDINGS: Segmentation:  Standard.  Alignment: 8 mm anterolisthesis L4 on L5. Minimal retrolisthesis T12 on L1 and L1 on L2.  Vertebrae: No fracture, evidence of discitis, or bone lesion. Prominent discogenic marrow changes at T12-L1.  Conus medullaris and cauda equina: Conus extends to the L1-L2 level. Conus and cauda equina appear normal.  Paraspinal and other soft tissues: Cortically based T2 hyperintense lesionswithin the bilateral kidneys, incompletely characterized, but most likely represent  cysts.  Disc levels:  T12-L1: Small right paracentral disc protrusion. No foraminal or canal stenosis.  L1-L2: Mild diffuse disc bulge and mild bilateral facet arthrosis. No foraminal or canal stenosis.  L2-L3: Mild diffuse disc bulge, mild bilateral facet arthrosis, and ligamentum flavum buckling with mild left foraminal stenosis. No canal stenosis.  L3-L4: Mild bilateral facet arthrosis and ligamentum flavum buckling. No significant disc protrusion, foraminal stenosis, or canal stenosis.  L4-L5: Disc uncovering with central disc protrusion. Advanced bilateral facet arthrosis. Bilateral subarticular recess narrowing. Mild right foraminal stenosis. No left foraminal or canal stenosis.  L5-S1: Diffuse disc bulge with advanced right greater than left facet arthrosis resulting in moderate right foraminal stenosis. No canal stenosis.  Incidental note of sacral perineural cysts.  IMPRESSION: 1. Multilevel lumbar spondylosis including grade 1 anterolisthesis of L4 on L5. There is bilateral subarticular recess narrowing and mild right foraminal stenosis at the L4-5 level. 2. Moderate right foraminal stenosis at L5-S1.  Chest Xray FINDINGS: Stable cardiomediastinal contours. No new focal infiltrate. No pneumothorax or pleural effusion. Visualized upper abdomen is unremarkable. Status post left shoulder arthroplasty. No acute osseous abnormality.  IMPRESSION: No active cardiopulmonary disease.  CT Angio Chest Aorta  FINDINGS: Cardiovascular: There is no cardiomegaly or pericardial effusion. Coronary vascular calcification as well as partial calcification of the mitral annulus. Six mild atherosclerotic calcification of the thoracic aorta. No aneurysmal dilatation or dissection. The origins of the great vessels of the aortic arch appear patent as visualized. There is no CT evidence of pulmonary embolism.  Mediastinum/Nodes: There is no hilar or mediastinal  adenopathy. The esophagus is grossly unremarkable. No mediastinal fluid collection.  Lungs/Pleura: An 8 mm ground-glass nodularity in the lingula (series 7, image 93) most likely represents an area of scarring. Attention on follow-up imaging recommended. The lungs are otherwise clear. There is no pleural effusion or pneumothorax. The central airways are patent.  Upper Abdomen: No acute abnormality.  Musculoskeletal: Multilevel degenerative changes of the spine. Left shoulder arthroplasty. No acute osseous pathology.  Review of the MIP images confirms the above findings.  IMPRESSION: No acute intrathoracic pathology. No CT evidence of pulmonary Embolism.  12/14/2018 TEE  IMPRESSIONS    1. The left ventricle has normal systolic function with an ejection fraction of 60-65%. The cavity size was normal. There is mild concentric left ventricular hypertrophy. Left ventricular diastolic Doppler parameters are indeterminate.  2. The right ventricle has normal systolic function. The cavity was mildly enlarged. There is no increase in right ventricular wall thickness.  3. The aortic valve is tricuspid. Aortic valve regurgitation is mild to moderate by color flow Doppler.  4. Trivial pericardial effusion is present.  5. There is an echodensity in the ascending aorta that is not well-visualized and may just represent artifact but in the setting of acute chest pain with mild-moderate AI, cannot rule out dissection flap. Recommend CTA for further evaluation. D/w  primary team, Dr  Harbrecht, at 17:15 on 12/14/18  FINDINGS  Left Ventricle: The left ventricle has normal systolic function, with an ejection fraction of 60-65%. The cavity size was normal. There is mild concentric left ventricular hypertrophy. Left ventricular diastolic Doppler parameters are indeterminate.  Right Ventricle: The right ventricle has normal systolic function. The cavity was mildly enlarged. There is no increase in  right ventricular wall thickness.  Left Atrium: Left atrial size was normal in size.  Right Atrium: Right atrial size was normal in size. Right atrial pressure is estimated at 10 mmHg.  Interatrial Septum: The atrial septum is grossly normal.  Pericardium: Trivial pericardial effusion is present.  Mitral Valve: The mitral valve is normal in structure. Mitral valve regurgitation is mild by color flow Doppler.  Tricuspid Valve: The tricuspid valve is normal in structure. Tricuspid valve regurgitation is trivial by color flow Doppler.  Aortic Valve: The aortic valve is tricuspid Aortic valve regurgitation is mild to moderate by color flow Doppler.  Pulmonic Valve: The pulmonic valve was not well visualized. Pulmonic valve regurgitation is not visualized by color flow Doppler.  Aorta: The aorta is abnormal unless otherwise noted. There is an echodensity in the asc  Venous: The inferior vena cava is normal in size with greater than 50% respiratory variability  Left Heart Cath  LV end diastolic pressure is normal.  There is no aortic valve stenosis.  Non-stenotic Prox LAD lesion -mild ectatic segment  Otherwise angiographically normal coronary arteries   SUMMARY  Angiographically normal coronary arteries with minimal disease.  Normal LVEDP   RECOMMENDATIONS  Return to nursing for ongoing care.  Consider non-ischemic coronary reason for symptoms and mild troponin elevation -could be related to demand ischemia  Would be stable for discharge from cardiac standpoint after TR band removal/bedrest   Discharge Instructions: Discharge Instructions    Call MD for:  persistant dizziness or light-headedness   Complete by: As directed    Diet - low sodium heart healthy   Complete by: As directed    Discharge instructions   Complete by: As directed    Janice Allen,  It was our pleasure to take part in your care. Your heart cath was normal and the cardiologist found  minimal disease. Coronary artery disease was not the cause of your chest discomfort and elevated troponin. I recommend follow-up with your primary care doctor.   My best,  Dr.Kroy Sprung   Increase activity slowly   Complete by: As directed       Signed:  Thurmon FairJeff Cayli Escajeda, MD PGY1  [phone]

## 2019-12-03 IMAGING — CT CT ANGIO CHEST
2 of 7 series · 18 of 46 positions shown · IV contrast (APPLIED)
Comparison: Chest CT dated 02/22/2012

CLINICAL DATA: 78-year-old female with chest pain.

EXAM:
CT ANGIOGRAPHY CHEST WITH CONTRAST
TECHNIQUE: Multidetector CT imaging of the chest was performed using the
standard protocol during bolus administration of intravenous
contrast. Multiplanar CT image reconstructions and MIPs were
obtained to evaluate the vascular anatomy.
CONTRAST:  75mL OMNIPAQUE IOHEXOL 350 MG/ML SOLN

[Series 6: thins · axial · 0.67mm/px · z∈[-243,-34]mm · 15 of 337 slices shown]
[im 19/337  lung]
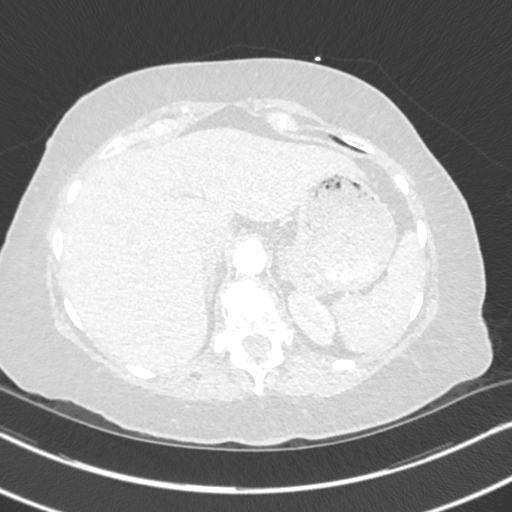
[im 38/337  soft-tissue]
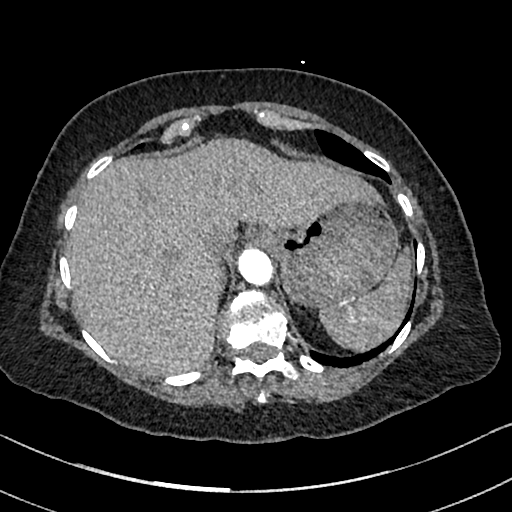
[im 57/337  lung]
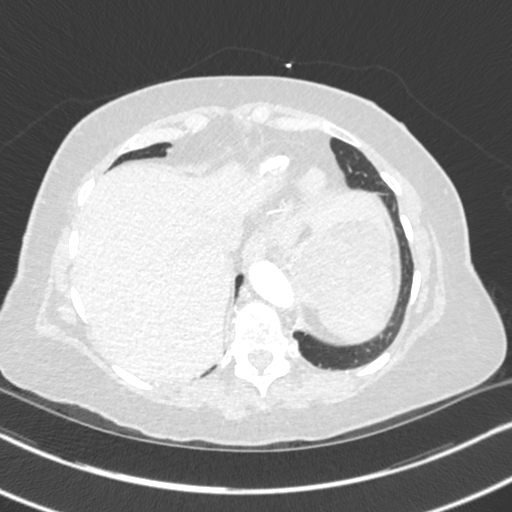
[im 75/337  soft-tissue]
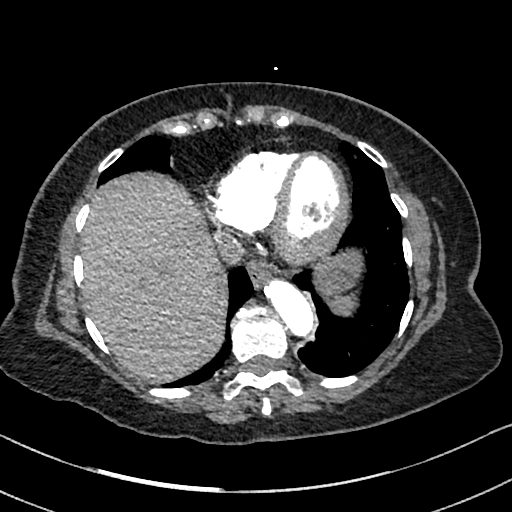
[im 113/337  lung]
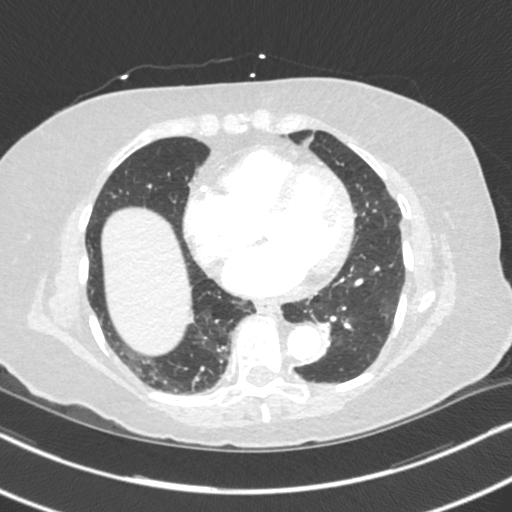
[im 131/337  soft-tissue]
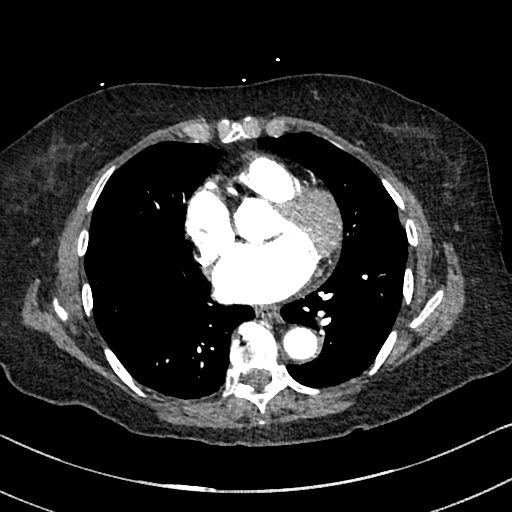
[im 150/337  lung]
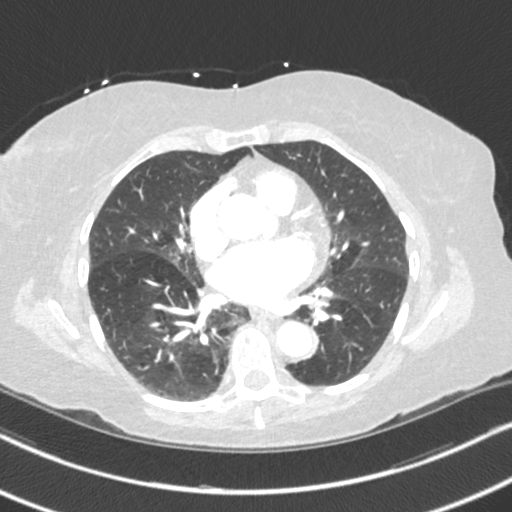
[im 169/337  soft-tissue]
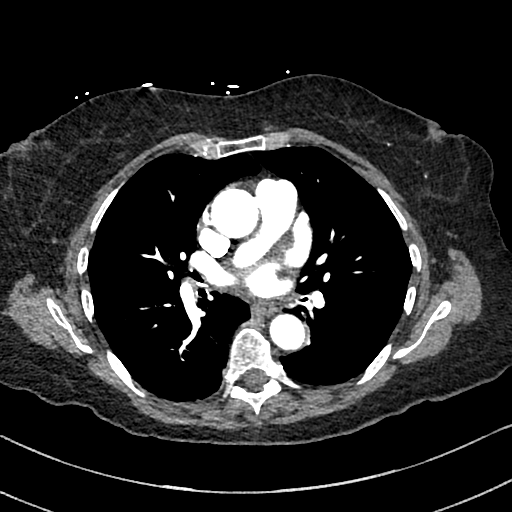
[im 187/337  lung]
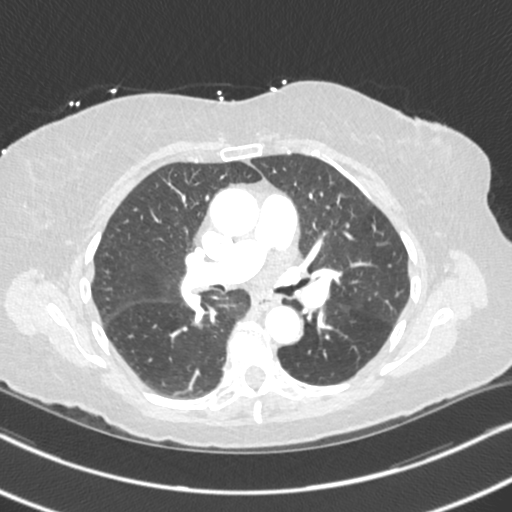
[im 206/337  soft-tissue]
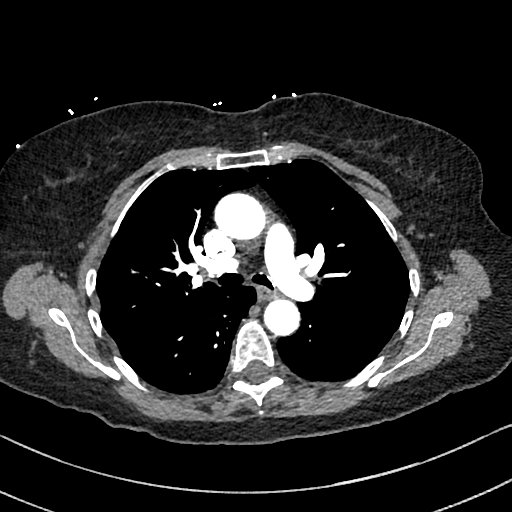
[im 225/337  lung]
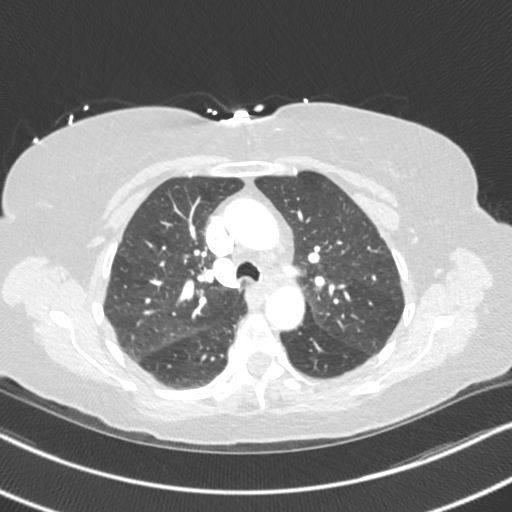
[im 262/337  soft-tissue]
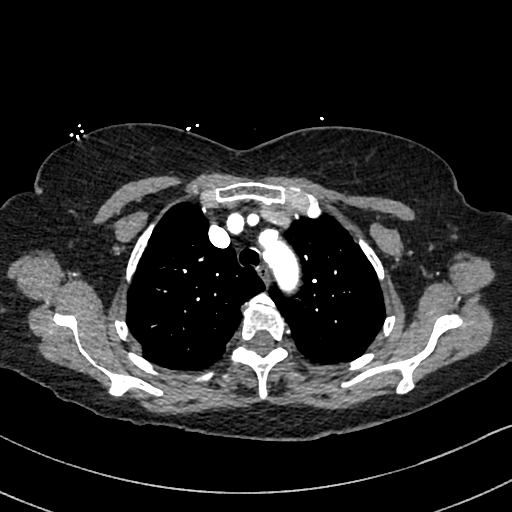
[im 281/337  lung]
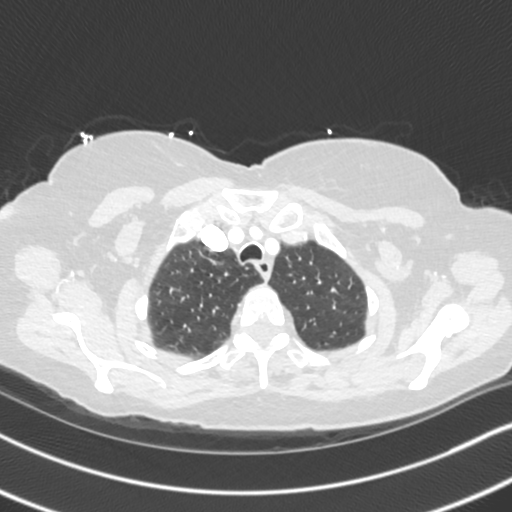
[im 299/337  soft-tissue]
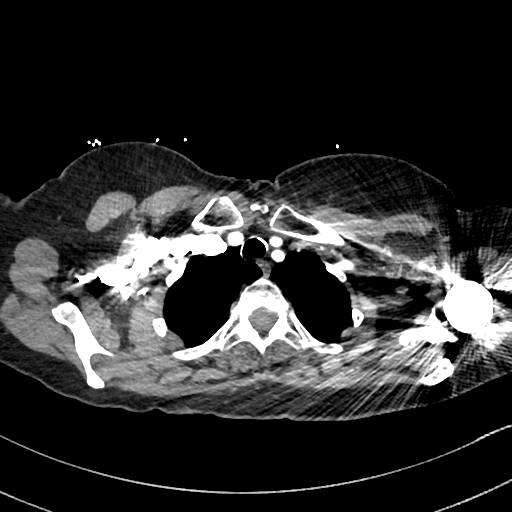
[im 318/337  lung]
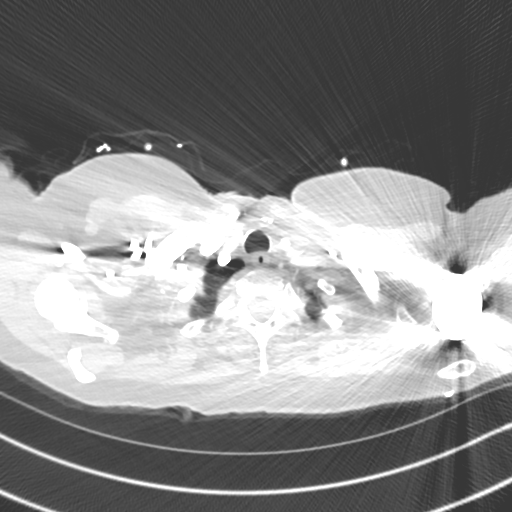

[Series 8: cor · coronal · 0.48mm/px · 3 of 119 slices shown]
[im 30/119  soft-tissue]
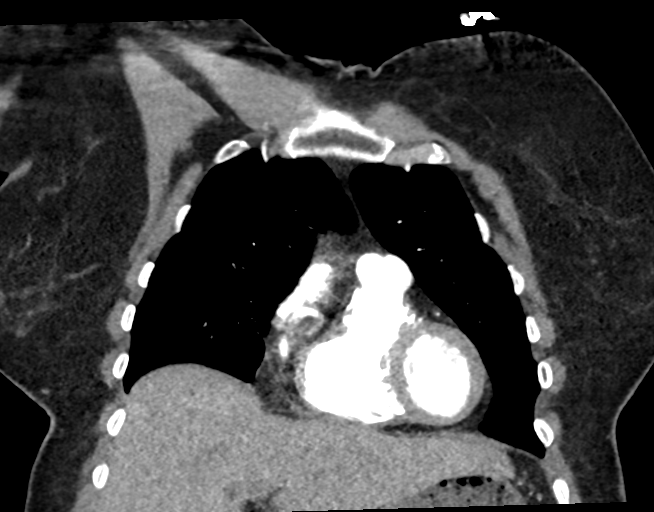
[im 60/119  soft-tissue]
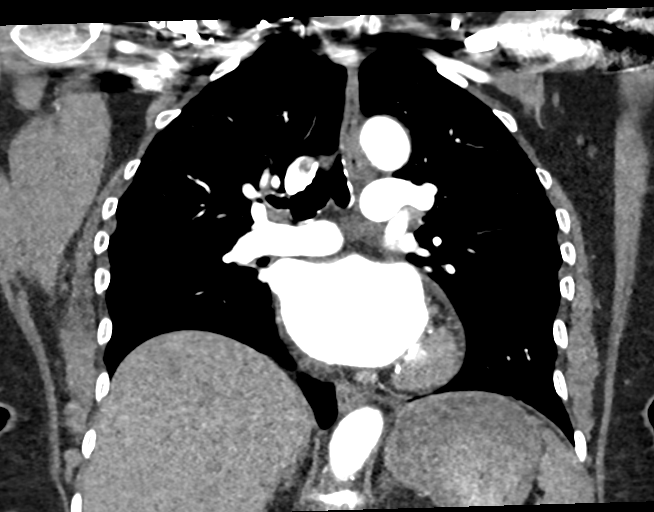
[im 89/119  soft-tissue]
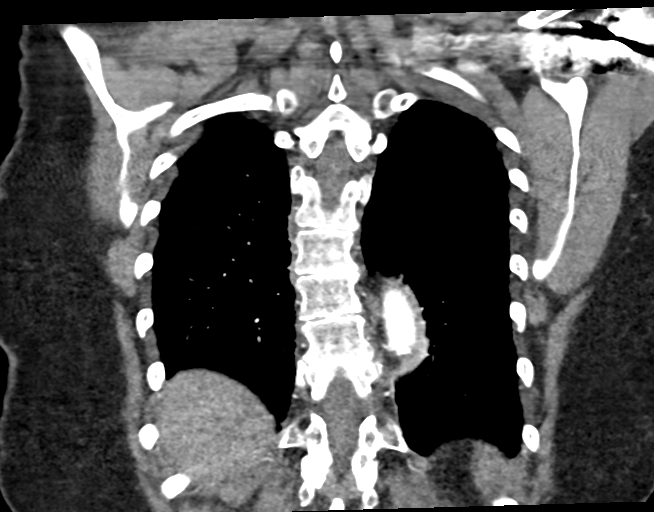

[18 of 46 positions shown; findings below may reference images not displayed]

FINDINGS: Cardiovascular: There is no cardiomegaly or pericardial effusion.
Coronary vascular calcification as well as partial calcification of
the mitral annulus. Six mild atherosclerotic calcification of the
thoracic aorta. No aneurysmal dilatation or dissection. The origins
of the great vessels of the aortic arch appear patent as visualized.
There is no CT evidence of pulmonary embolism.

Mediastinum/Nodes: There is no hilar or mediastinal adenopathy. The
esophagus is grossly unremarkable. No mediastinal fluid collection.

Lungs/Pleura: An 8 mm ground-glass nodularity in the lingula (series
7, image 93) most likely represents an area of scarring. Attention
on follow-up imaging recommended. The lungs are otherwise clear.
There is no pleural effusion or pneumothorax. The central airways
are patent.

Upper Abdomen: No acute abnormality.

Musculoskeletal: Multilevel degenerative changes of the spine. Left
shoulder arthroplasty. No acute osseous pathology.

Review of the MIP images confirms the above findings.
IMPRESSION: No acute intrathoracic pathology. No CT evidence of pulmonary
embolism.

Aortic Atherosclerosis ([id number]).

## 2022-06-28 ENCOUNTER — Other Ambulatory Visit (HOSPITAL_COMMUNITY): Payer: Self-pay | Admitting: Internal Medicine

## 2022-06-28 ENCOUNTER — Ambulatory Visit (HOSPITAL_COMMUNITY)
Admission: RE | Admit: 2022-06-28 | Discharge: 2022-06-28 | Disposition: A | Discharge status: Home / Self Care | Payer: Medicare Other | Source: Ambulatory Visit | Attending: Vascular Surgery | Admitting: Vascular Surgery

## 2022-06-28 DIAGNOSIS — R29898 Other symptoms and signs involving the musculoskeletal system: Secondary | ICD-10-CM

## 2022-06-28 LAB — VAS US ABI WITH/WO TBI
Left ABI: 1.26
Right ABI: 1.17

## 2023-02-28 ENCOUNTER — Other Ambulatory Visit: Payer: Self-pay | Admitting: Internal Medicine

## 2023-02-28 DIAGNOSIS — Z1231 Encounter for screening mammogram for malignant neoplasm of breast: Secondary | ICD-10-CM

## 2023-04-24 ENCOUNTER — Ambulatory Visit: Payer: Medicare Other

## 2023-05-27 ENCOUNTER — Other Ambulatory Visit: Payer: Self-pay | Admitting: Internal Medicine

## 2023-05-27 DIAGNOSIS — R911 Solitary pulmonary nodule: Secondary | ICD-10-CM

## 2023-06-10 ENCOUNTER — Other Ambulatory Visit: Payer: Medicare Other
